# Patient Record
Sex: Female | Born: 1960 | Race: Black or African American | Hispanic: No | Marital: Single | Smoking: Never smoker
Health system: Southern US, Community
[De-identification: ages and names within clinical notes are randomized; demographics above are authoritative.]

## PROBLEM LIST (undated history)

## (undated) DIAGNOSIS — L309 Dermatitis, unspecified: Secondary | ICD-10-CM

## (undated) DIAGNOSIS — D509 Iron deficiency anemia, unspecified: Secondary | ICD-10-CM

## (undated) DIAGNOSIS — D219 Benign neoplasm of connective and other soft tissue, unspecified: Secondary | ICD-10-CM

## (undated) DIAGNOSIS — D259 Leiomyoma of uterus, unspecified: Secondary | ICD-10-CM

## (undated) DIAGNOSIS — F32A Depression, unspecified: Secondary | ICD-10-CM

## (undated) DIAGNOSIS — N950 Postmenopausal bleeding: Secondary | ICD-10-CM

## (undated) DIAGNOSIS — E611 Iron deficiency: Secondary | ICD-10-CM

## (undated) DIAGNOSIS — E785 Hyperlipidemia, unspecified: Secondary | ICD-10-CM

## (undated) DIAGNOSIS — I10 Essential (primary) hypertension: Secondary | ICD-10-CM

## (undated) DIAGNOSIS — K649 Unspecified hemorrhoids: Secondary | ICD-10-CM

## (undated) DIAGNOSIS — F329 Major depressive disorder, single episode, unspecified: Secondary | ICD-10-CM

## (undated) DIAGNOSIS — K219 Gastro-esophageal reflux disease without esophagitis: Secondary | ICD-10-CM

## (undated) DIAGNOSIS — Z8742 Personal history of other diseases of the female genital tract: Secondary | ICD-10-CM

## (undated) DIAGNOSIS — Z973 Presence of spectacles and contact lenses: Secondary | ICD-10-CM

## (undated) HISTORY — PX: CERVIX SURGERY: SHX593

## (undated) HISTORY — DX: Hyperlipidemia, unspecified: E78.5

## (undated) HISTORY — DX: Major depressive disorder, single episode, unspecified: F32.9

## (undated) HISTORY — DX: Leiomyoma of uterus, unspecified: D25.9

## (undated) HISTORY — DX: Iron deficiency anemia, unspecified: D50.9

## (undated) HISTORY — DX: Dermatitis, unspecified: L30.9

## (undated) HISTORY — DX: Iron deficiency: E61.1

## (undated) HISTORY — DX: Benign neoplasm of connective and other soft tissue, unspecified: D21.9

## (undated) HISTORY — DX: Essential (primary) hypertension: I10

## (undated) HISTORY — PX: BREAST SURGERY: SHX581

## (undated) HISTORY — DX: Depression, unspecified: F32.A

---

## 1986-01-13 HISTORY — PX: CERVICAL CONE BIOPSY: SUR198

## 1990-01-13 HISTORY — PX: BIOPSY BREAST: PRO8

## 1990-01-13 HISTORY — PX: EXCISION OF BREAST BIOPSY: SHX5822

## 1998-09-05 ENCOUNTER — Other Ambulatory Visit: Admission: RE | Admit: 1998-09-05 | Discharge: 1998-09-05 | Payer: Self-pay | Admitting: Obstetrics and Gynecology

## 1999-02-26 ENCOUNTER — Other Ambulatory Visit: Admission: RE | Admit: 1999-02-26 | Discharge: 1999-02-26 | Payer: Self-pay | Admitting: Obstetrics and Gynecology

## 1999-09-13 ENCOUNTER — Other Ambulatory Visit: Admission: RE | Admit: 1999-09-13 | Discharge: 1999-09-13 | Payer: Self-pay | Admitting: Obstetrics and Gynecology

## 2000-09-24 ENCOUNTER — Other Ambulatory Visit: Admission: RE | Admit: 2000-09-24 | Discharge: 2000-09-24 | Payer: Self-pay | Admitting: Obstetrics and Gynecology

## 2004-01-14 HISTORY — PX: CERVICAL BIOPSY  W/ LOOP ELECTRODE EXCISION: SUR135

## 2004-01-14 HISTORY — PX: LEEP: SHX91

## 2004-08-27 ENCOUNTER — Ambulatory Visit (HOSPITAL_COMMUNITY): Admission: RE | Admit: 2004-08-27 | Discharge: 2004-08-27 | Payer: Self-pay | Admitting: Obstetrics and Gynecology

## 2010-08-15 ENCOUNTER — Ambulatory Visit (INDEPENDENT_AMBULATORY_CARE_PROVIDER_SITE_OTHER): Payer: BC Managed Care – PPO

## 2010-08-15 ENCOUNTER — Inpatient Hospital Stay (INDEPENDENT_AMBULATORY_CARE_PROVIDER_SITE_OTHER)
Admission: RE | Admit: 2010-08-15 | Discharge: 2010-08-15 | Disposition: A | Discharge status: Home / Self Care | Payer: BC Managed Care – PPO | Source: Ambulatory Visit | Attending: Family Medicine | Admitting: Family Medicine

## 2010-08-15 DIAGNOSIS — M79609 Pain in unspecified limb: Secondary | ICD-10-CM

## 2011-01-14 HISTORY — PX: COLONOSCOPY: SHX174

## 2011-04-22 LAB — HM PAP SMEAR: HM Pap smear: NORMAL

## 2011-07-24 LAB — HM COLONOSCOPY: HM Colonoscopy: NORMAL

## 2012-10-04 ENCOUNTER — Ambulatory Visit: Payer: Self-pay | Admitting: Nurse Practitioner

## 2012-10-28 ENCOUNTER — Ambulatory Visit: Payer: Self-pay | Admitting: Nurse Practitioner

## 2012-12-21 ENCOUNTER — Encounter: Payer: Self-pay | Admitting: Nurse Practitioner

## 2012-12-22 ENCOUNTER — Encounter: Payer: Self-pay | Admitting: Nurse Practitioner

## 2012-12-22 ENCOUNTER — Ambulatory Visit (INDEPENDENT_AMBULATORY_CARE_PROVIDER_SITE_OTHER): Payer: BC Managed Care – PPO | Admitting: Nurse Practitioner

## 2012-12-22 VITALS — BP 108/64 | HR 75 | Temp 98.0°F | Resp 16 | Ht 62.0 in | Wt 107.6 lb

## 2012-12-22 DIAGNOSIS — E785 Hyperlipidemia, unspecified: Secondary | ICD-10-CM | POA: Insufficient documentation

## 2012-12-22 DIAGNOSIS — R5381 Other malaise: Secondary | ICD-10-CM

## 2012-12-22 DIAGNOSIS — Z23 Encounter for immunization: Secondary | ICD-10-CM

## 2012-12-22 DIAGNOSIS — R030 Elevated blood-pressure reading, without diagnosis of hypertension: Secondary | ICD-10-CM

## 2012-12-22 DIAGNOSIS — L259 Unspecified contact dermatitis, unspecified cause: Secondary | ICD-10-CM

## 2012-12-22 DIAGNOSIS — I10 Essential (primary) hypertension: Secondary | ICD-10-CM | POA: Insufficient documentation

## 2012-12-22 DIAGNOSIS — L309 Dermatitis, unspecified: Secondary | ICD-10-CM | POA: Insufficient documentation

## 2012-12-22 DIAGNOSIS — D509 Iron deficiency anemia, unspecified: Secondary | ICD-10-CM

## 2012-12-22 DIAGNOSIS — E611 Iron deficiency: Secondary | ICD-10-CM | POA: Insufficient documentation

## 2012-12-22 NOTE — Patient Instructions (Signed)
-  Have GI send [place] your lab work -make follow up appt for next year

## 2012-12-22 NOTE — Progress Notes (Signed)
Patient ID: Anna Mclean, female   DOB: [birth year], 52 y.o.   MRN: [id number]    No Known Allergies  Chief Complaint  Patient presents with  . Establish Care    establish care (NP)  . Immunizations    declines flu vaccine, and Td > 10 years  . other    very fatigue (drives 1 hour one way)    HPI: Patient is a 52 y.o. female seen in the office today to establish care; sees a gyn and GI doctor but no PCP.  Has history of hyperlipidemia and boarderline hypertension but this was modify with diet and exercise Last cholesterol screening was 3 years ago  Dr Henderson Cloud- GYN; mammogram 2014 normal- yearly;last PAP 2014,  - PAP have been normal, STD screening normal Dr Loreta AveSandria Manly; colonoscopy 2013- every 10 years  Gluten free diet due to iron deficient anemia; iron increased but she was loosing weight following up with Loreta Ave next week to see if she can reintroduce iron back into her diet  Occasional chest pain in different places, notices these pains randomly without associations and does not happen frequently; reports this pain is a 3/10; could be related to acid reflux (reports gurgling) last 3-5 secs every 2 weeks also reports upper chest pain 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 Negative for fever, chills and weight loss.  HENT: Negative for ear pain.   Eyes:       Dry eyes; corrective lens, follows with eye MD  Respiratory: Negative for cough and shortness of breath.   Cardiovascular: Negative for chest pain, palpitations, claudication and leg swelling.  Gastrointestinal: Positive for heartburn (rarely). Negative for abdominal pain, diarrhea and constipation.  Genitourinary: Negative for dysuria, urgency and frequency.  Musculoskeletal:  Positive for joint pain (occasionally thumbs ). Negative for back pain and myalgias.  Skin: Negative for itching and rash.  Neurological: Positive for headaches (occasional headaches that she has had her whole life). Negative for dizziness and tingling.  Psychiatric/Behavioral: Negative for depression. The patient is not nervous/anxious and does not have insomnia.      Past Medical History  Diagnosis Date  . Fibroids   . Low iron   . Eczema of hand   . Depression   . Hypertension   . Hyperlipidemia   . Anemia    Past Surgical History  Procedure Laterality Date  . Cervix surgery  1980's  . Breast surgery      calicum deposit removed   Social History:   reports that she has never smoked. She has never used smokeless tobacco. She reports that she drinks alcohol. She reports that she does not use illicit drugs.  Family History  Problem Relation Age of Onset  . Diabetes Father   . Arthritis Mother   . Lupus Mother     Medications: Patient's Medications  New Prescriptions   No medications on file  Previous Medications   DESOGESTREL-ETHINYL ESTRADIOL (VIORELE) 0.15-0.02/0.01 MG (21/5) TABLET    Take 1 tablet by mouth daily.   POLYVINYL ALCOHOL-POVIDONE (REFRESH OP)    Apply to eye as needed.  Modified Medications   No medications on file  Discontinued Medications   No medications on file     Physical Exam:  Ceasar Mons  Vitals:   12/22/12 0906  BP: 108/64  Pulse: 75  Temp: 98 F (36.7 C)  TempSrc: Oral  Resp: 16  Height: 5\' 2"  (1.575 m)  Weight: 107 lb 9.6 oz (48.807 kg)  SpO2: 98%    Physical Exam  Constitutional: She is oriented to person, place, and time and well-developed, well-nourished, and in no distress.  HENT:  Head: Normocephalic and atraumatic.  Right Ear: External ear normal.  Left Ear: External ear normal.  Nose: Nose normal.  Mouth/Throat: Oropharynx is clear and moist. No oropharyngeal exudate.  Eyes: Conjunctivae and EOM are normal. Pupils are  equal, round, and reactive to light.  Neck: Normal range of motion. Neck supple. No thyromegaly present.  Cardiovascular: Normal rate and regular rhythm.   Pulmonary/Chest: Effort normal and breath sounds normal. No respiratory distress.  Abdominal: Soft. Bowel sounds are normal. She exhibits no distension. There is no tenderness.  Musculoskeletal: Normal range of motion. She exhibits no edema and no tenderness.  Lymphadenopathy:    She has no cervical adenopathy.  Neurological: She is alert and oriented to person, place, and time.  Skin: Skin is warm and dry. She is not diaphoretic.  Psychiatric: Affect normal.     Assessment/Plan 1. Hyperlipidemia -will follow up lipid screening at next visit; pt wishes to wait until 2015 before getting labs  2. Iron deficiency anemia -following with GI; pt will get these labs sent to [place] for our records  3. Eczema of hand -well controlled with lotion  4. Borderline hypertension -diet and exercise controlled -will cont to follow  5. Need for Tdap vaccination - Tdap vaccine greater than or equal to 7yo IM  6. Fatigue Was doing regular exercise now is not Sleeping 7 hours a night Encourage to get back into exercise routine and to eat more fruits and vegetable; diet and exercise plays a roll in the feeling of fatigue -encouraged to get rolling bag and not to carry large back pack on shoulders  If this does not improve will get TSH at next visit   Will call for follow up physical

## 2013-06-02 LAB — HM PAP SMEAR

## 2013-06-07 ENCOUNTER — Other Ambulatory Visit: Payer: Self-pay | Admitting: Obstetrics and Gynecology

## 2013-06-07 DIAGNOSIS — N644 Mastodynia: Secondary | ICD-10-CM

## 2013-06-16 ENCOUNTER — Ambulatory Visit
Admission: RE | Admit: 2013-06-16 | Discharge: 2013-06-16 | Disposition: A | Discharge status: Home / Self Care | Payer: Managed Care, Other (non HMO) | Source: Ambulatory Visit | Attending: Obstetrics and Gynecology | Admitting: Obstetrics and Gynecology

## 2013-06-16 ENCOUNTER — Other Ambulatory Visit: Payer: Self-pay | Admitting: Obstetrics and Gynecology

## 2013-06-16 DIAGNOSIS — N644 Mastodynia: Secondary | ICD-10-CM

## 2014-05-09 ENCOUNTER — Encounter: Payer: Self-pay | Admitting: Nurse Practitioner

## 2014-05-09 ENCOUNTER — Ambulatory Visit (INDEPENDENT_AMBULATORY_CARE_PROVIDER_SITE_OTHER): Payer: Managed Care, Other (non HMO) | Admitting: Nurse Practitioner

## 2014-05-09 VITALS — BP 122/70 | HR 74 | Temp 98.0°F | Resp 18 | Ht 62.0 in | Wt 116.6 lb

## 2014-05-09 DIAGNOSIS — D509 Iron deficiency anemia, unspecified: Secondary | ICD-10-CM

## 2014-05-09 DIAGNOSIS — G44209 Tension-type headache, unspecified, not intractable: Secondary | ICD-10-CM

## 2014-05-09 DIAGNOSIS — E785 Hyperlipidemia, unspecified: Secondary | ICD-10-CM

## 2014-05-09 NOTE — Patient Instructions (Signed)
Will get blood work today  Use aleve 220 mg by mouth twice daily with food for 1 week then as needed Relaxation exercise, massage and heat  Follow up in 1 month

## 2014-05-09 NOTE — Progress Notes (Signed)
Patient ID: Anna Mclean, female   DOB: 12-Nov-1960, 54 y.o.   MRN: [id number]    PCP: Lauree Chandler, NP  Allergies  Allergen Reactions  . Shellfish Allergy Rash    Seen on face and around mouth    Chief Complaint  Patient presents with  . Medical Management of Chronic Issues  . Acute Visit    bad headaches since early March, pressure in ears and nasal area     HPI: Patient is a 54 y.o. female seen in the office today due to headaches. Pt having shooting pains in her head. Increased family and personal stress.  Question wax build up in her ears Pain in her neck and the back of her hand, no injury. Sometimes she sleeps certain ways and this increases the pain but does not always correlate to the pain.  Headaches on and off for years-- stress related, not ongoing. Would take an ASA which would help. Current headache is different which started end of February. Has tired ASA which has been effective. Happening about once a week.  Will happen in the morning or in the evening. Worse with stress. Worse on the right side of her head but sometimes it will be both sides, parietal and occipital areas-- not frontal Shooting pain. Not worse with light or sound. No nausea. Able to work through the pain  If she does not take ASA may last 15 mins-45 mins.      Sees GYN yearly for annual exam, gets breast exam, pelvic,mammogram yearly, due in June Fasting today  Review of Systems:  Review of Systems  Constitutional: Negative for fever and chills.  HENT: Negative for congestion, ear pain and sinus pressure.   Eyes:       Dry eyes; corrective lens, follows with eye MD  Respiratory: Negative for cough and shortness of breath.   Cardiovascular: Negative for chest pain and palpitations.  Gastrointestinal: Negative for diarrhea and constipation.  Musculoskeletal: Negative for myalgias, back pain, neck pain and neck stiffness.  Skin: Negative for color change and rash.  Neurological:  Positive for headaches (see HPI). Negative for dizziness, tremors, seizures, facial asymmetry, speech difficulty, weakness, light-headedness and numbness.  Psychiatric/Behavioral: The patient is nervous/anxious.     Past Medical History  Diagnosis Date  . Fibroids   . Low iron   . Eczema of hand   . Depression   . Hypertension   . Hyperlipidemia   . Iron deficiency anemia    Past Surgical History  Procedure Laterality Date  . Cervix surgery  1980's  . Breast surgery      calicum deposit removed   Social History:   reports that she has never smoked. She has never used smokeless tobacco. She reports that she drinks alcohol. She reports that she does not use illicit drugs.  Family History  Problem Relation Age of Onset  . Diabetes Father   . Arthritis Mother   . Lupus Mother     Medications: Patient's Medications  New Prescriptions   No medications on file  Previous Medications   DESOGESTREL-ETHINYL ESTRADIOL (VIORELE) 0.15-0.02/0.01 MG (21/5) TABLET    Take 1 tablet by mouth daily.   FERROUS SULFATE 325 (65 FE) MG TABLET    Take 325 mg by mouth 2 (two) times daily with a meal.   FLUOXETINE (PROZAC) 20 MG CAPSULE    Take 20 mg by mouth at bedtime.   POLYVINYL ALCOHOL-POVIDONE (REFRESH OP)    Apply to eye as needed.  Modified Medications   No medications on file  Discontinued Medications   No medications on file     Physical Exam:  Filed Vitals:   05/09/14 0837  BP: 122/70  Pulse: 74  Temp: 98 F (36.7 C)  TempSrc: Oral  Resp: 18  Height: 5\' 2"  (1.575 m)  Weight: 116 lb 9.6 oz (52.889 kg)  SpO2: 99%    Physical Exam  Constitutional: She is oriented to person, place, and time. She appears well-developed and well-nourished. No distress.  HENT:  Head: Normocephalic and atraumatic.  Right Ear: External ear normal.  Left Ear: External ear normal.  Nose: Nose normal.  Mouth/Throat: Oropharynx is clear and moist. No oropharyngeal exudate.  Eyes: Conjunctivae  and EOM are normal. Pupils are equal, round, and reactive to light.  Neck: Normal range of motion. Neck supple.  Cardiovascular: Normal rate, regular rhythm and normal heart sounds.   Pulmonary/Chest: Effort normal and breath sounds normal.  Abdominal: Soft. Bowel sounds are normal.  Musculoskeletal: She exhibits no edema or tenderness.  Lymphadenopathy:    She has no cervical adenopathy.  Neurological: She is alert and oriented to person, place, and time. She has normal reflexes. She displays normal reflexes. No cranial nerve deficit or sensory deficit. She exhibits normal muscle tone. Coordination normal.  Skin: Skin is warm and dry. She is not diaphoretic.  Psychiatric: She has a normal mood and affect.    Labs reviewed: Basic Metabolic Panel: No results for input(s): NA, K, CL, CO2, GLUCOSE, BUN, CREATININE, CALCIUM, MG, PHOS, TSH in the last 8760 hours. Liver Function Tests: No results for input(s): AST, ALT, ALKPHOS, BILITOT, PROT, ALBUMIN in the last 8760 hours. No results for input(s): LIPASE, AMYLASE in the last 8760 hours. No results for input(s): AMMONIA in the last 8760 hours. CBC: No results for input(s): WBC, NEUTROABS, HGB, HCT, MCV, PLT in the last 8760 hours. Lipid Panel: No results for input(s): CHOL, HDL, LDLCALC, TRIG, CHOLHDL, LDLDIRECT in the last 8760 hours. TSH: No results for input(s): TSH in the last 8760 hours. A1C: No results found for: HGBA1C   Assessment/Plan 1. Tension-type headache, not intractable, unspecified chronicity pattern -no warning signs noted, no neurological deficits associated with headaches or on exam. BP in appropriate range, no hx of HTN - due to the increase stress with job and person life most likely tension-type headache. -Use aleve 220 mg by mouth twice daily with food for 1 week then as needed Relaxation exercise, massage and heat as needed encouraged -will get blood work today - CBC with Differential/Platelet - Sedimentation  Rate -CMP  2. Borderline hyperlipidemia -will obtain labs at this time - Comprehensive metabolic panel - Lipid panel  3. Iron deficiency anemia -conts on iron -will follow up CBC   Will follow up in 1 month on headaches

## 2014-05-10 LAB — COMPREHENSIVE METABOLIC PANEL
ALK PHOS: 73 IU/L (ref 39–117)
ALT: 9 IU/L (ref 0–32)
AST: 14 IU/L (ref 0–40)
Albumin/Globulin Ratio: 1.4 (ref 1.1–2.5)
Albumin: 4.3 g/dL (ref 3.5–5.5)
BILIRUBIN TOTAL: 0.3 mg/dL (ref 0.0–1.2)
BUN/Creatinine Ratio: 17 (ref 9–23)
BUN: 12 mg/dL (ref 6–24)
CALCIUM: 9.3 mg/dL (ref 8.7–10.2)
CHLORIDE: 103 mmol/L (ref 97–108)
CO2: 19 mmol/L (ref 18–29)
Creatinine, Ser: 0.72 mg/dL (ref 0.57–1.00)
GFR calc non Af Amer: 96 mL/min/{1.73_m2} (ref >59)
GFR, EST AFRICAN AMERICAN: 111 mL/min/{1.73_m2} (ref >59)
GLOBULIN, TOTAL: 3 g/dL (ref 1.5–4.5)
Glucose: 86 mg/dL (ref 65–99)
Potassium: 4 mmol/L (ref 3.5–5.2)
SODIUM: 143 mmol/L (ref 134–144)
Total Protein: 7.3 g/dL (ref 6.0–8.5)

## 2014-05-10 LAB — LIPID PANEL
CHOLESTEROL TOTAL: 255 mg/dL — AB (ref 100–199)
Chol/HDL Ratio: 3.1 ratio units (ref 0.0–4.4)
HDL: 83 mg/dL (ref >39)
LDL Calculated: 154 mg/dL — ABNORMAL HIGH (ref 0–99)
Triglycerides: 90 mg/dL (ref 0–149)
VLDL CHOLESTEROL CAL: 18 mg/dL (ref 5–40)

## 2014-05-10 LAB — CBC WITH DIFFERENTIAL/PLATELET
BASOS ABS: 0.1 10*3/uL (ref 0.0–0.2)
Basos: 1 %
EOS (ABSOLUTE): 0.1 10*3/uL (ref 0.0–0.4)
Eos: 2 %
HEMATOCRIT: 36.6 % (ref 34.0–46.6)
Hemoglobin: 12 g/dL (ref 11.1–15.9)
Immature Grans (Abs): 0 10*3/uL (ref 0.0–0.1)
Immature Granulocytes: 0 %
LYMPHS ABS: 1.2 10*3/uL (ref 0.7–3.1)
LYMPHS: 19 %
MCH: 29.1 pg (ref 26.6–33.0)
MCHC: 32.8 g/dL (ref 31.5–35.7)
MCV: 89 fL (ref 79–97)
MONOS ABS: 0.4 10*3/uL (ref 0.1–0.9)
Monocytes: 6 %
NEUTROS ABS: 4.6 10*3/uL (ref 1.4–7.0)
Neutrophils: 72 %
Platelets: 292 10*3/uL (ref 150–379)
RBC: 4.13 x10E6/uL (ref 3.77–5.28)
RDW: 13.9 % (ref 12.3–15.4)
WBC: 6.4 10*3/uL (ref 3.4–10.8)

## 2014-05-10 LAB — SEDIMENTATION RATE: Sed Rate: 20 mm/hr (ref 0–40)

## 2014-06-08 ENCOUNTER — Encounter: Payer: Self-pay | Admitting: Nurse Practitioner

## 2014-06-08 ENCOUNTER — Ambulatory Visit (INDEPENDENT_AMBULATORY_CARE_PROVIDER_SITE_OTHER): Payer: Managed Care, Other (non HMO) | Admitting: Nurse Practitioner

## 2014-06-08 VITALS — BP 128/82 | HR 71 | Temp 98.2°F | Resp 18 | Ht 62.0 in | Wt 117.4 lb

## 2014-06-08 DIAGNOSIS — G44209 Tension-type headache, unspecified, not intractable: Secondary | ICD-10-CM

## 2014-06-08 DIAGNOSIS — E785 Hyperlipidemia, unspecified: Secondary | ICD-10-CM

## 2014-06-08 DIAGNOSIS — D509 Iron deficiency anemia, unspecified: Secondary | ICD-10-CM | POA: Diagnosis not present

## 2014-06-08 DIAGNOSIS — F419 Anxiety disorder, unspecified: Secondary | ICD-10-CM | POA: Diagnosis not present

## 2014-06-08 NOTE — Progress Notes (Signed)
Patient ID: Anna Mclean, female   DOB: 1960-05-28, 54 y.o.   MRN: [id number]    PCP: Lauree Chandler, NP  Allergies  Allergen Reactions  . Shellfish Allergy Rash    Seen on face and around mouth    Chief Complaint  Patient presents with  . Medical Management of Chronic Issues     HPI: Patient is a 54 y.o. female seen in the office today to follow up headaches, headaches went away after last visit. Then got another headache yesterday and took aleve today and it has improved. Doing relaxation techniques, feels like it was the way she was sleeping and stress related. Stress at work and at home. Used heat and exercise after last visit. Exercise helped the most.   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  HENT: Negative for congestion, ear pain and sinus pressure.   Eyes:       Dry eyes; corrective lens, follows with eye MD  Respiratory: Negative for cough and shortness of breath.   Cardiovascular: Negative for chest pain and palpitations.  Gastrointestinal: Negative for diarrhea and constipation.  Musculoskeletal: Negative for myalgias, back pain, neck pain and neck stiffness.  Skin: Negative for color change and rash.  Neurological: Positive for headaches (has improved). Negative for dizziness, tremors, seizures, facial asymmetry, speech difficulty, weakness, light-headedness and numbness.  Psychiatric/Behavioral: The patient is nervous/anxious.     Past Medical History  Diagnosis Date  . Fibroids   . Low iron   . Eczema of hand   . Depression   . Hypertension   . Hyperlipidemia   . Iron deficiency anemia    Past Surgical History  Procedure Laterality Date    . Cervix surgery  1980's  . Breast surgery      calicum deposit removed   Social History:   reports that she has never smoked. She has never used smokeless tobacco. She reports that she drinks alcohol. She reports that she does not use illicit drugs.  Family History  Problem Relation Age of Onset  . Diabetes Father   . Arthritis Mother   . Lupus Mother     Medications: Patient's Medications  New Prescriptions   No medications on file  Previous Medications   DESOGESTREL-ETHINYL ESTRADIOL (VIORELE) 0.15-0.02/0.01 MG (21/5) TABLET    Take 1 tablet by mouth daily.   FERROUS SULFATE 325 (65 FE) MG TABLET    Take 325 mg by mouth 2 (two) times daily with a meal.   FLUOXETINE (PROZAC) 20 MG CAPSULE    Take 20 mg by mouth at bedtime.   POLYVINYL ALCOHOL-POVIDONE (REFRESH OP)    Apply to eye as needed.  Modified Medications   No medications on file  Discontinued Medications   No medications on file     Physical Exam:  Filed Vitals:   06/08/14 1054  BP: 128/82  Pulse: 71  Temp: 98.2 F (36.8 C)  TempSrc: Oral  Resp: 18  Height: 5\' 2"  (1.575 m)  Weight: 117 lb 6.4 oz (53.252 kg)  SpO2: 99%    Physical Exam  Constitutional: She is oriented to person, place, and time. She appears well-developed and well-nourished. No distress.  HENT:  Head: Normocephalic and atraumatic.  Right Ear: External ear normal.  Left Ear: External ear normal.  Nose: Nose normal.  Mouth/Throat: Oropharynx is clear and moist. No oropharyngeal exudate.  Eyes: Conjunctivae and EOM are normal. Pupils are equal, round, and reactive to light.  Neck: Normal range of motion. Neck supple.  Cardiovascular: Normal rate, regular rhythm and normal heart sounds.   Pulmonary/Chest: Effort normal and breath sounds normal.  Abdominal: Soft. Bowel sounds are normal.  Musculoskeletal: She exhibits no edema or tenderness.  Lymphadenopathy:    She has no cervical adenopathy.  Neurological: She is alert and oriented  to person, place, and time. She has normal reflexes. No cranial nerve deficit or sensory deficit. She exhibits normal muscle tone. Coordination normal.  Skin: Skin is warm and dry. She is not diaphoretic.  Psychiatric: She has a normal mood and affect.    Labs reviewed: Basic Metabolic Panel:  Recent Labs  05/09/14 0945  NA 143  K 4.0  CL 103  CO2 19  GLUCOSE 86  BUN 12  CREATININE 0.72  CALCIUM 9.3   Liver Function Tests:  Recent Labs  05/09/14 0945  AST 14  ALT 9  ALKPHOS 73  BILITOT 0.3  PROT 7.3   No results for input(s): LIPASE, AMYLASE in the last 8760 hours. No results for input(s): AMMONIA in the last 8760 hours. CBC:  Recent Labs  05/09/14 0945  WBC 6.4  NEUTROABS 4.6  HCT 36.6   Lipid Panel:  Recent Labs  05/09/14 0945  CHOL 255*  HDL 83  LDLCALC 154*  TRIG 90  CHOLHDL 3.1   TSH: No results for input(s): TSH in the last 8760 hours. A1C: No results found for: HGBA1C   Assessment/Plan 1. Tension-type headache, not intractable, unspecified chronicity pattern -improved, worse with increase stress, to cont lifestyle modification when she can -avoid excessive stressors.   2. Borderline hyperlipidemia -discussed lipid, elevated LDL but elevated HDL as well.  -she reports she will make some changes to lower LDL -dietary modifications for now  3. Iron deficiency anemia Cbc reviewed with pt, hgb stable at this time -conts on iron twice daily   4. Anxiety - conts on fluoxetine 20 mg daily   Follow up in 6 months, sooner if needed

## 2014-06-22 ENCOUNTER — Other Ambulatory Visit: Payer: Self-pay | Admitting: Obstetrics and Gynecology

## 2014-06-22 DIAGNOSIS — R928 Other abnormal and inconclusive findings on diagnostic imaging of breast: Secondary | ICD-10-CM

## 2014-06-29 ENCOUNTER — Ambulatory Visit
Admission: RE | Admit: 2014-06-29 | Discharge: 2014-06-29 | Disposition: A | Discharge status: Home / Self Care | Payer: Managed Care, Other (non HMO) | Source: Ambulatory Visit | Attending: Obstetrics and Gynecology | Admitting: Obstetrics and Gynecology

## 2014-06-29 DIAGNOSIS — R928 Other abnormal and inconclusive findings on diagnostic imaging of breast: Secondary | ICD-10-CM

## 2014-12-12 ENCOUNTER — Ambulatory Visit: Payer: Managed Care, Other (non HMO) | Admitting: Nurse Practitioner

## 2015-08-14 LAB — HM MAMMOGRAPHY

## 2015-08-17 LAB — HM PAP SMEAR

## 2016-02-10 IMAGING — MG MM DIAG BREAST TOMO UNI LEFT
6 series · 6 of 18 positions shown · non-contrast
Comparison: 05/10/2010 through 06/02/2013

CLINICAL DATA: Patient is an asymptomatic 54-year-old female who
was recalled from screening mammography to further evaluate the left
breast.

EXAM:
DIGITAL DIAGNOSTIC LEFT MAMMOGRAM WITH 3D TOMOSYNTHESIS AND CAD

[L ML (1 of 2)]
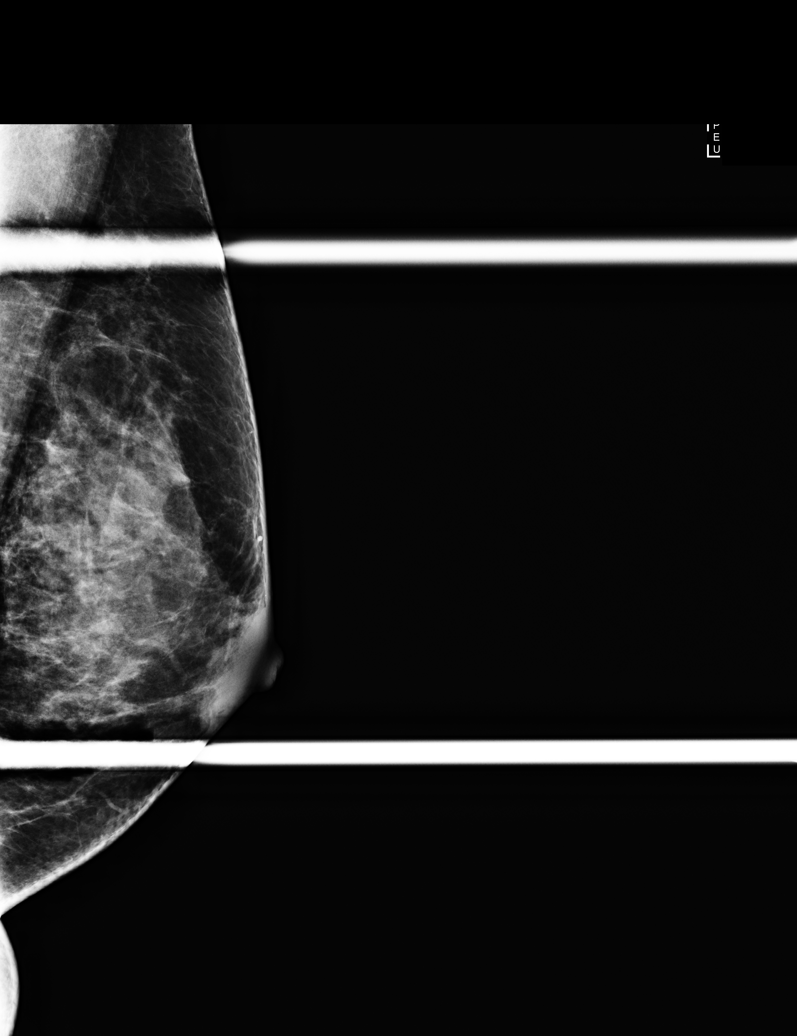

[L MLO]
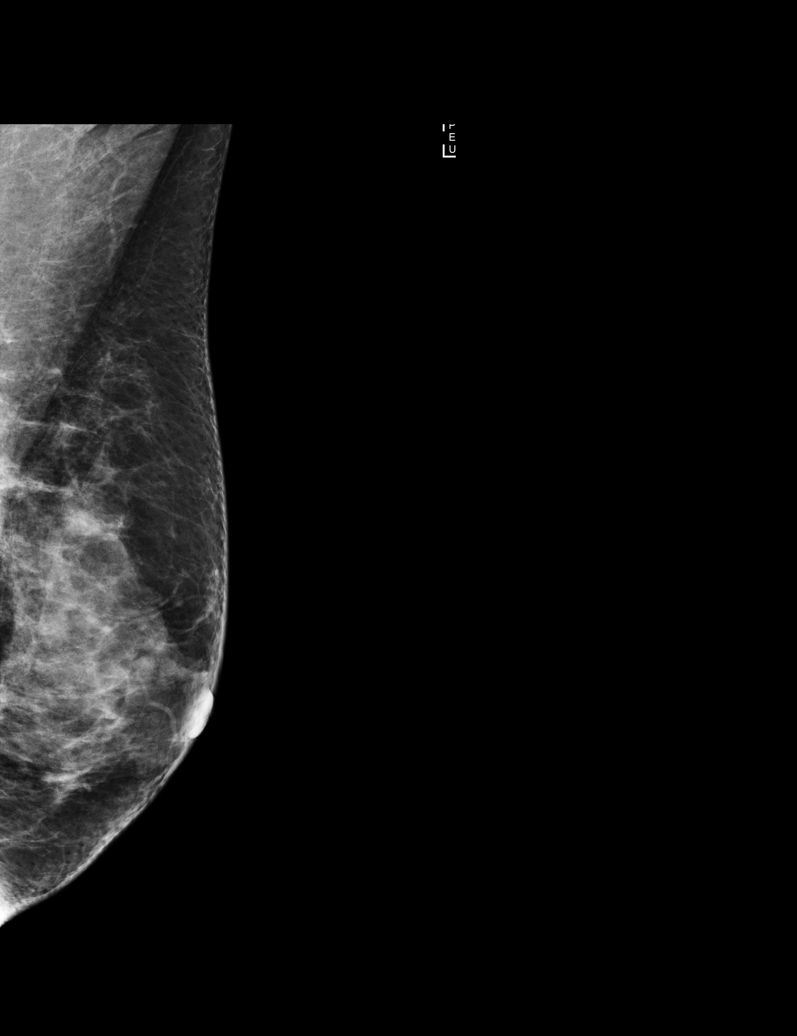

[L ML (2 of 2)]
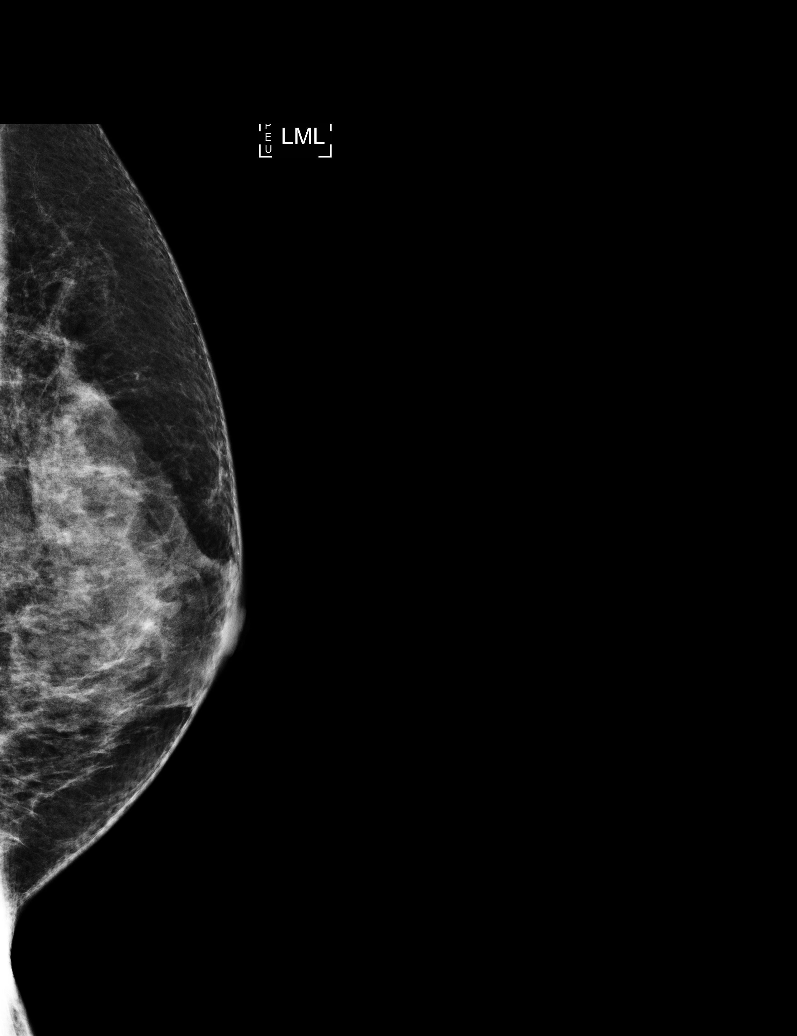

[L ML tomo (1 of 2) · tomo slice 31/62.0]
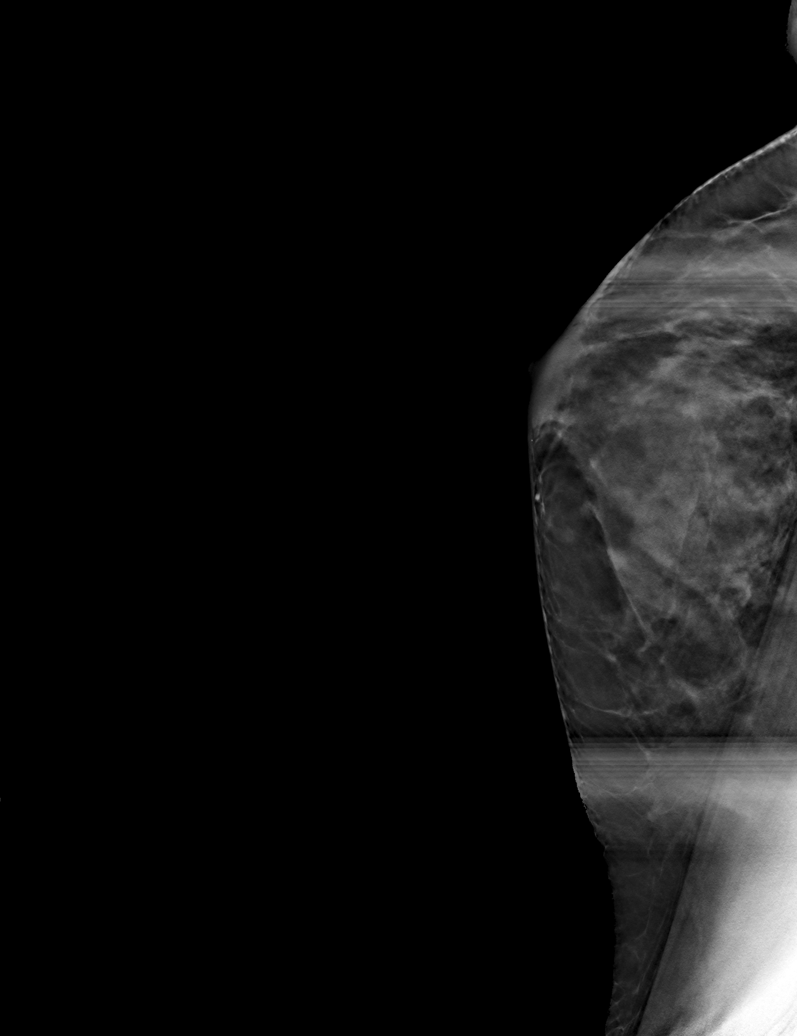

[L MLO tomo · tomo slice 33/65.0]
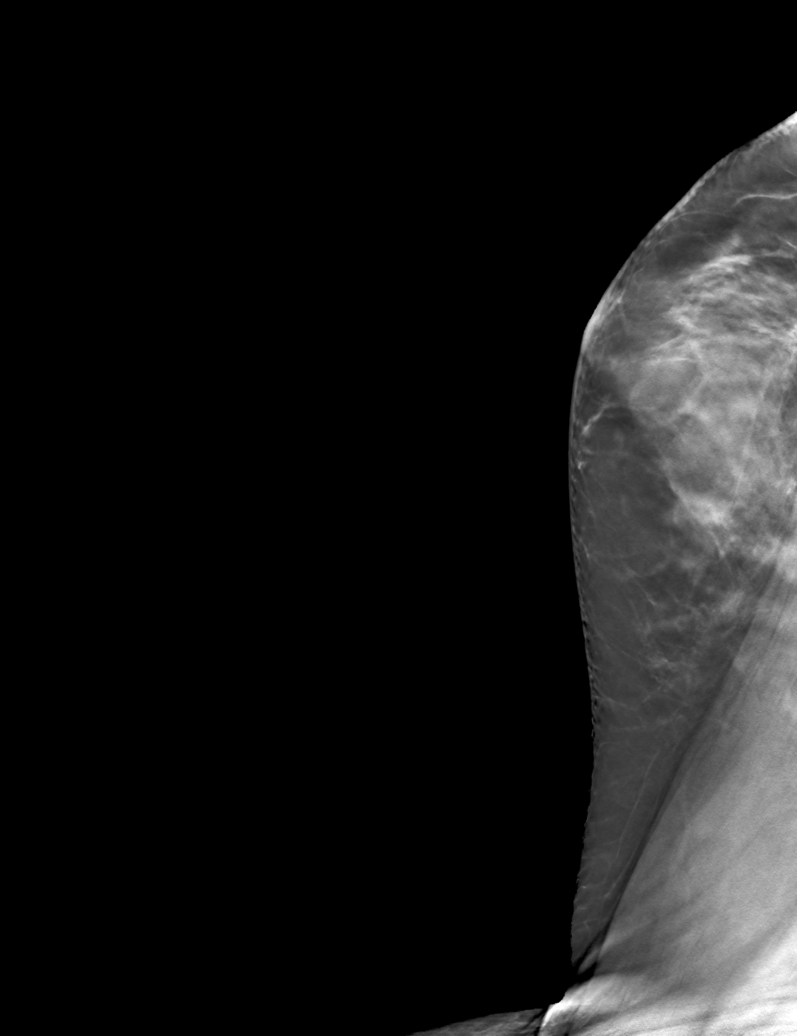

[L ML tomo (2 of 2) · tomo slice 29/56.0]
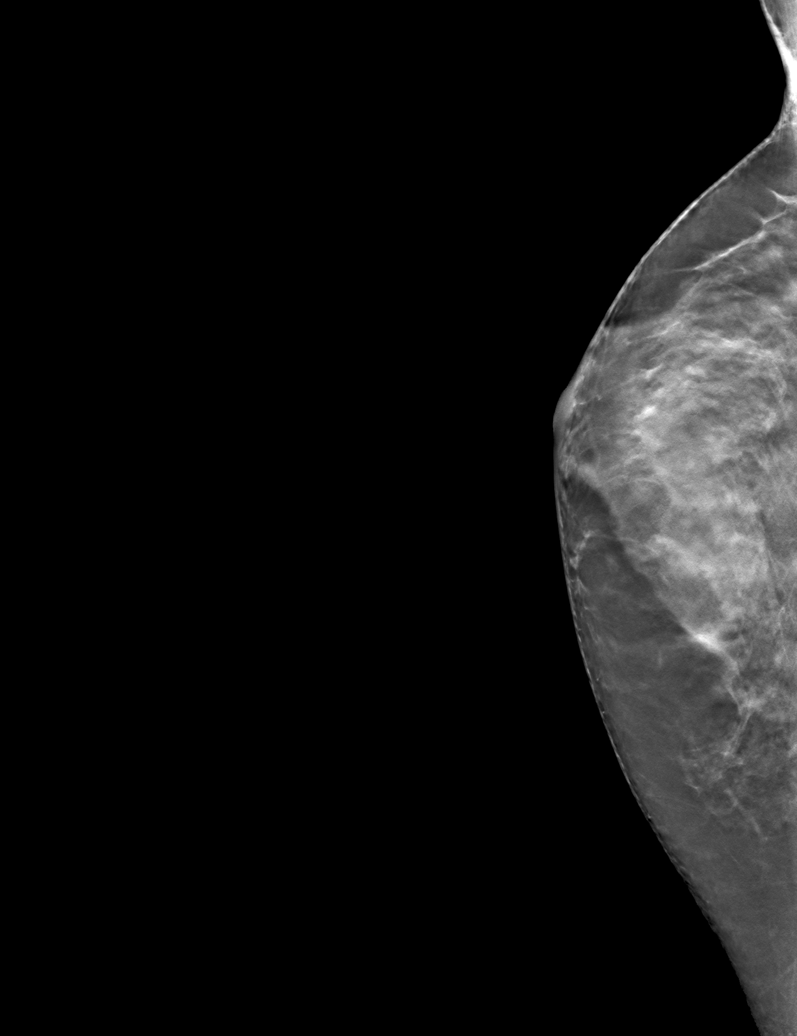

[6 of 18 positions shown; findings below may reference images not displayed]

ACR Breast Density Category c: The breast tissue is heterogeneously
dense, which may obscure small masses.
FINDINGS: Left unilateral digital diagnostic mammography demonstrates a
heterogeneously dense parenchymal pattern which may obscure
detection of small masses. The previously noted questionable
asymmetry is not reproducible on the mediolateral or mediolateral
oblique mammographic projections and has hair the breast completely
effaces with spot compression view consistent with summation of
normal fibroglandular tissue. No abnormal mass or suspicious
microcalcification.

Mammographic images were processed with CAD.
IMPRESSION: No mammographic findings of malignancy in the left breast.

RECOMMENDATION:
Screening mammogram in one year.(Code:SQ-Y-T0U)

I have discussed the findings and recommendations with the patient.
Results were also provided in writing at the conclusion of the
visit. If applicable, a reminder letter will be sent to the patient
regarding the next appointment.

BI-RADS CATEGORY  1: Negative.

## 2018-08-18 ENCOUNTER — Other Ambulatory Visit: Payer: Self-pay

## 2018-08-18 DIAGNOSIS — Z20822 Contact with and (suspected) exposure to covid-19: Secondary | ICD-10-CM

## 2018-08-19 LAB — NOVEL CORONAVIRUS, NAA: SARS-CoV-2, NAA: NOT DETECTED

## 2019-11-14 ENCOUNTER — Other Ambulatory Visit: Payer: Managed Care, Other (non HMO)

## 2019-11-14 DIAGNOSIS — Z20822 Contact with and (suspected) exposure to covid-19: Secondary | ICD-10-CM

## 2019-11-15 LAB — SARS-COV-2, NAA 2 DAY TAT

## 2019-11-15 LAB — NOVEL CORONAVIRUS, NAA: SARS-CoV-2, NAA: NOT DETECTED

## 2020-07-04 ENCOUNTER — Other Ambulatory Visit (HOSPITAL_BASED_OUTPATIENT_CLINIC_OR_DEPARTMENT_OTHER): Payer: Self-pay

## 2020-07-04 ENCOUNTER — Other Ambulatory Visit: Payer: Self-pay

## 2020-07-04 ENCOUNTER — Ambulatory Visit: Payer: Managed Care, Other (non HMO) | Attending: Internal Medicine

## 2020-07-04 DIAGNOSIS — Z23 Encounter for immunization: Secondary | ICD-10-CM

## 2020-07-04 MED ORDER — PFIZER-BIONT COVID-19 VAC-TRIS 30 MCG/0.3ML IM SUSP
INTRAMUSCULAR | 0 refills | Status: DC
  Filled 2020-07-04: qty 0.3, 1d supply, fill #0

## 2020-07-04 NOTE — Progress Notes (Signed)
   Covid-19 Vaccination Clinic  Name:  Anna Mclean    MRN: [id number] DOB: 12/02/1960  07/04/2020  Anna Mclean was observed post Covid-19 immunization for 15 minutes without incident. She was provided with Vaccine Information Sheet and instruction to access the V-Safe system.   Anna Mclean was instructed to call 911 with any severe reactions post vaccine: Difficulty breathing  Swelling of face and throat  A fast heartbeat  A bad rash all over body  Dizziness and weakness   Immunizations Administered     Name Date Dose VIS Date Route   PFIZER Comrnaty(Gray TOP) Covid-19 Vaccine 07/04/2020  2:14 PM 0.3 mL 12/22/2019 Intramuscular   Manufacturer: [place]   Lot: [id number]   [place]: [phone]

## 2020-07-05 ENCOUNTER — Other Ambulatory Visit (HOSPITAL_BASED_OUTPATIENT_CLINIC_OR_DEPARTMENT_OTHER): Payer: Self-pay

## 2020-12-07 ENCOUNTER — Encounter (HOSPITAL_COMMUNITY): Payer: Self-pay | Admitting: Emergency Medicine

## 2020-12-07 ENCOUNTER — Ambulatory Visit (HOSPITAL_COMMUNITY)
Admission: EM | Admit: 2020-12-07 | Discharge: 2020-12-07 | Disposition: A | Discharge status: Home / Self Care | Payer: Managed Care, Other (non HMO) | Attending: Internal Medicine | Admitting: Internal Medicine

## 2020-12-07 ENCOUNTER — Other Ambulatory Visit: Payer: Self-pay

## 2020-12-07 DIAGNOSIS — J020 Streptococcal pharyngitis: Secondary | ICD-10-CM

## 2020-12-07 LAB — POC INFLUENZA A AND B ANTIGEN (URGENT CARE ONLY)
INFLUENZA A ANTIGEN, POC: NEGATIVE
INFLUENZA B ANTIGEN, POC: NEGATIVE

## 2020-12-07 LAB — POCT RAPID STREP A, ED / UC: Streptococcus, Group A Screen (Direct): POSITIVE — AB

## 2020-12-07 MED ORDER — AMOXICILLIN 500 MG PO CAPS: 500.0000 mg | ORAL_CAPSULE | Freq: Two times a day (BID) | ORAL | 0 refills | Status: AC

## 2020-12-07 NOTE — ED Provider Notes (Signed)
[place]    CSN: [id number] Arrival date & time: 12/07/20  1238      History   Chief Complaint Chief Complaint  Patient presents with   Cough   Sore Throat    HPI Anna Mclean is a 60 y.o. female comes to the urgent care with a 1 day history of sore throat and painful swallowing.  Patient symptoms started insidiously and has been persistent.  She describes subjective fever without chills.  No nausea, vomiting or diarrhea.  Patient denies any runny nose or nasal congestion.Marland Kitchen   HPI  Past Medical History:  Diagnosis Date   Depression    Eczema of hand    Fibroids    Hyperlipidemia    Hypertension    Iron deficiency anemia    Low iron     Patient Active Problem List   Diagnosis Date Noted   Borderline hypertension 12/22/2012   Borderline hyperlipidemia    Iron deficiency anemia    Eczema of hand     Past Surgical History:  Procedure Laterality Date   BREAST SURGERY     calicum deposit removed   CERVICAL BIOPSY  W/ LOOP ELECTRODE EXCISION  01/14/2004   CERVIX SURGERY  1980's    OB History   No obstetric history on file.      Home Medications    Prior to Admission medications   Medication Sig Start Date End Date Taking? Authorizing Provider  amoxicillin (AMOXIL) 500 MG capsule Take 1 capsule (500 mg total) by mouth 2 (two) times daily for 10 days. 12/07/20 12/17/20 Yes Khamani Daniely, Myrene Galas, MD  COVID-19 mRNA Vac-TriS, Pfizer, (PFIZER-BIONT COVID-19 VAC-TRIS) SUSP injection Inject into the muscle. 07/04/20   Carlyle Basques, MD  desogestrel-ethinyl estradiol (VIORELE) 0.15-0.02/0.01 MG (21/5) tablet Take 1 tablet by mouth daily.    [provider]  ferrous sulfate 325 (65 FE) MG tablet Take 325 mg by mouth 2 (two) times daily with a meal.    [provider]  norethindrone-ethinyl estradiol (FYAVOLV) 1-5 MG-MCG TABS tablet Fyavolv 1 mg-5 mcg tablet  TAKE 1 TABLET BY MOUTH EVERY DAY    [provider]  Polyvinyl  Alcohol-Povidone ([place] [facility]) Apply to eye as needed.    [provider]    Family History Family History  Problem Relation Age of Onset   Diabetes Father    Arthritis Mother    Lupus Mother     Social History Social History   Tobacco Use   Smoking status: Never   Smokeless tobacco: Never  Substance Use Topics   Alcohol use: Yes    Comment: Once every 3-4 months (very infrequently)    Drug use: No     Allergies   Shellfish allergy   Review of Systems Review of Systems  Constitutional: Negative.   HENT:  Positive for sore throat and trouble swallowing.   Respiratory: Negative.    Gastrointestinal: Negative.   Genitourinary: Negative.   Neurological: Negative.     Physical Exam Triage Vital Signs ED Triage Vitals  Enc Vitals Group     BP 12/07/20 1354 (!) 151/88     Pulse Rate 12/07/20 1354 98     Resp 12/07/20 1354 18     Temp 12/07/20 1354 99.5 F (37.5 C)     Temp src --      SpO2 12/07/20 1354 100 %     Weight --      Height --      Head Circumference --  Peak Flow --      Pain Score 12/07/20 1352 0     Pain Loc --      Pain Edu? --      Excl. in [place]? --    No data found.  Updated Vital Signs BP (!) 151/88   Pulse 98   Temp 99.5 F (37.5 C)   Resp 18   SpO2 100%   Visual Acuity Right Eye Distance:   Left Eye Distance:   Bilateral Distance:    Right Eye Near:   Left Eye Near:    Bilateral Near:     Physical Exam Vitals and nursing note reviewed.  Constitutional:      General: She is not in acute distress.    Appearance: She is not ill-appearing.  HENT:     Right Ear: Tympanic membrane normal.     Left Ear: Tympanic membrane normal.     Nose: No rhinorrhea.     Mouth/Throat:     Tonsils: Tonsillar exudate present. No tonsillar abscesses. 0 on the right. 0 on the left.  Cardiovascular:     Rate and Rhythm: Normal rate and regular rhythm.  Neurological:     Mental Status: She is alert.     UC Treatments / Results   Labs (all labs ordered are listed, but only abnormal results are displayed) Labs Reviewed  POCT RAPID STREP A, ED / UC - Abnormal; Notable for the following components:      Result Value   Streptococcus, Group A Screen (Direct) POSITIVE (*)    All other components within normal limits  POC INFLUENZA A AND B ANTIGEN (URGENT CARE ONLY)    EKG   Radiology No results found.  Procedures Procedures (including critical care time)  Medications Ordered in UC Medications - No data to display  Initial Impression / Assessment and Plan / UC Course  I have reviewed the triage vital signs and the nursing notes.  Pertinent labs & imaging results that were available during my care of the patient were reviewed by me and considered in my medical decision making (see chart for details).     1.  Streptococcal pharyngitis: Point-of-care strep is positive Point of care flu is negative for flu A and B Warm salt water gargle Ibuprofen or Tylenol as needed for pain Return to urgent care if symptoms worsen. Amoxicillin 500 mg twice daily for 10 days. Final Clinical Impressions(s) / UC Diagnoses   Final diagnoses:  Strep throat     Discharge Instructions      Warm salt water gargle Tylenol or Motrin as needed for pain Return to urgent care if symptoms worsen.   ED Prescriptions     Medication Sig Dispense Auth. Provider   amoxicillin (AMOXIL) 500 MG capsule Take 1 capsule (500 mg total) by mouth 2 (two) times daily for 10 days. 20 capsule Tryniti Laatsch, Myrene Galas, MD      PDMP not reviewed this encounter.   Chase Picket, MD 12/07/20 1444

## 2020-12-07 NOTE — ED Triage Notes (Signed)
Pt is present today with sore throat, cough, and chest congestion. Pt sx started last night

## 2020-12-07 NOTE — Discharge Instructions (Addendum)
Warm salt water gargle Tylenol or Motrin as needed for pain Return to urgent care if symptoms worsen.

## 2021-12-08 LAB — COLOGUARD: Cologuard: NEGATIVE

## 2021-12-13 ENCOUNTER — Other Ambulatory Visit: Payer: Self-pay

## 2021-12-19 ENCOUNTER — Encounter: Payer: Self-pay | Admitting: Nurse Practitioner

## 2022-03-04 LAB — HM PAP SMEAR

## 2023-03-12 DIAGNOSIS — Z9289 Personal history of other medical treatment: Secondary | ICD-10-CM

## 2023-03-12 HISTORY — DX: Personal history of other medical treatment: Z92.89

## 2023-03-12 LAB — HM MAMMOGRAPHY

## 2023-05-27 ENCOUNTER — Other Ambulatory Visit: Payer: Self-pay | Admitting: Obstetrics and Gynecology

## 2023-05-27 DIAGNOSIS — Z01818 Encounter for other preprocedural examination: Secondary | ICD-10-CM

## 2023-05-27 MED ORDER — METRONIDAZOLE IVPB CUSTOM
500.0000 mg | Freq: Two times a day (BID) | INTRAVENOUS | Status: AC
  Administered 2023-10-07: 500 mg via INTRAVENOUS

## 2023-09-14 NOTE — H&P (Signed)
 63 y.o. complains of enlarging fibroid uterus with postmenopausal bleeding.  Previously: Pre Op visit today. robotic assisted hysterectomy with salpingectomy scheduled for 09/24 d/t fibroids. -SH//  Previously: RTC 2 weeks for results and next steps. Uterus is bigger than 2024 with largest fibroid increased in size. uterine leiomyoma Pelvic T/V U/S Ordered US  pelvis complete and transvaginal Provided uterine fibroids: care instructions postmenopausal bleeding Checked tissue specimen-endometrium (formalin-filled specimen container only)- uwh [place] [facility] History of Present Illness GYN scan today d/t uterine fibroids. -SH//  EM is still obscured by fibroids and unable to be evaluated. For EMB for pmp bleeding today.  Physician interpretation: 14x11x8; EM NOT seen- obscured. Nine measured fibroids, largest 8 cm mid body. All calcified, ?one degenerating with fluid around it? Ov not seen./mahmd  Only LUS em gotten with specimen but suspect that fibroids are distorting the cavity. Fibroids have gotten substantially bigger- d/w pt that this is the time to a. make sure we can get the uterus out with minimally invasive and b. stop chasing PMP bleeding. D/w pt risks and benefits of leaving or taking ovaries- will decide closer to surgery. Will schedule in Sept. Still has had episodes of bleeding.  Past Medical History:  Diagnosis Date   Depression    Eczema of hand    Fibroids    H/O mammogram 03/12/2023   Per records from OB/GYN   Hyperlipidemia    Hypertension    Iron deficiency anemia    Low iron    Uterine fibroid    per records from GYN   Uterine leiomyoma    per records from GYN   Past Surgical History:  Procedure Laterality Date   BIOPSY BREAST  1992   per records from GYN   BREAST SURGERY     calicum deposit removed   CERVICAL BIOPSY  W/ LOOP ELECTRODE EXCISION  01/14/2004   CERVIX SURGERY  1980's   COLONOSCOPY  01/14/2011   cologuard negative 12/08/2021, Per records  from OB/GYN   LEEP  2006   Per records from GYN    Social History   Socioeconomic History   Marital status: Single    Spouse name: Not on file   Number of children: Not on file   Years of education: Not on file   Highest education level: Not on file  Occupational History   Not on file  Tobacco Use   Smoking status: Never   Smokeless tobacco: Never  Substance and Sexual Activity   Alcohol use: Yes    Comment: Once every 3-4 months (very infrequently)    Drug use: No   Sexual activity: Yes    Birth control/protection: Pill  Other Topics Concern   Not on file  Social History Narrative   LMP 10/19/2018, per records from GYN   Social Drivers of Health   Financial Resource Strain: Not on file  Food Insecurity: Not on file  Transportation Needs: Not on file  Physical Activity: Not on file  Stress: Not on file  Social Connections: Not on file  Intimate Partner Violence: Not on file    No current facility-administered medications on file prior to encounter.   Current Outpatient Medications on File Prior to Encounter  Medication Sig Dispense Refill   COVID-19 mRNA Vac-TriS, Pfizer, (PFIZER-BIONT COVID-19 VAC-TRIS) SUSP injection Inject into the muscle. 0.3 mL 0   desogestrel-ethinyl estradiol (VIORELE) 0.15-0.02/0.01 MG (21/5) tablet Take 1 tablet by mouth daily.     ferrous sulfate 325 (65 FE) MG tablet Take 325 mg  by mouth 2 (two) times daily with a meal.     norethindrone-ethinyl estradiol (FYAVOLV) 1-5 MG-MCG TABS tablet Fyavolv 1 mg-5 mcg tablet  TAKE 1 TABLET BY MOUTH EVERY DAY     Polyvinyl Alcohol-Povidone ([place] OP) Apply to eye as needed.      Allergies  Allergen Reactions   Shellfish Allergy Rash    Seen on face and around mouth    There were no vitals filed for this visit.  Lungs: clear to ascultation Cor:  RRR Abdomen:  soft, nontender, nondistended. Ex:  no cords, erythema Pelvic:   Vulva: no masses, no atrophy, no lesions Vagina: no tenderness,  no erythema, no abnormal vaginal discharge, no vesicle(s) or ulcers, no cystocele, no rectocele Cervix: grossly normal, no discharge, no cervical motion tenderness, sample taken for a Pap smear Uterus: midline, no uterine prolapse, non-tender, enlarged (back to 19 weeks, fills pelvis), contour irregular, fibroids Bladder/Urethra: normal meatus, no urethral discharge, no urethral mass, bladder non distended, Urethra well supported Adnexa/Parametria: no parametrial tenderness, no parametrial mass, no adnexal tenderness, no ovarian mass   A:  For robotic TLH/salpingectomies/cystoscopy. [facility] entry at [facility] point.  The pt wishes to retain her ovaries unless frankly abnormal...   P: P: All risks, benefits and alternatives d/w patient and she desires to proceed.  Patient has undergone an ERAS protocol and will receive preop antibiotics and SCDs during the operation.   Pt to have extended recovery but will go home same day if eating, ambulating, voiding and pain control is good.  Anna Mclean

## 2023-09-30 ENCOUNTER — Encounter (HOSPITAL_COMMUNITY): Payer: Self-pay | Admitting: Obstetrics and Gynecology

## 2023-10-01 ENCOUNTER — Encounter (HOSPITAL_COMMUNITY): Payer: Self-pay | Admitting: Obstetrics and Gynecology

## 2023-10-01 NOTE — Pre-Procedure Instructions (Signed)
 Surgical Instructions   Your procedure is scheduled on :  Wednesday,  10-07-2023. Report to [facility] Main Entrance A at 5:30  A.M., then check in with the Admitting office. Any questions or running late day of surgery: call [phone]  Questions prior to your surgery date: call [phone], Monday-Friday, 8am-4pm. If you experience any cold or flu symptoms such as cough, fever, chills, shortness of breath, etc. between now and your scheduled surgery, please notify your surgeon office.    Remember:  Do not eat any food and do not drink any liquids after midnight the night before your surgery.  This includes no water,  candy,  gum,  and  mints.    Take these medicines the morning of surgery with A SIP OF WATER :  NONE   May take these medicines IF NEEDED: NONE    One week prior to surgery, STOP taking any Aspirin (unless otherwise instructed by your surgeon) Aleve, Naproxen, Ibuprofen, Motrin, Advil, Goody's, BC's, all herbal medications, fish oil, and non-prescription vitamins.                     Do NOT Smoke (Tobacco/Vaping) and Do Not drink alcohol for 24 hours prior to your procedure.  If you use a CPAP at night, you may bring your mask/headgear for your overnight stay.   You will be asked to remove any contacts, glasses, piercing's, hearing aid's, dentures/partials prior to surgery. Please bring cases/ contact solution/ etc., for these items day of surgery.   Patients discharged the day of surgery will not be allowed to drive home, and someone needs to stay with them for 24 hours.  SURGICAL WAITING ROOM VISITATION Patients may have no more than 2 support people in the waiting area - these visitors may rotate.   Pre-op nurse will coordinate an appropriate time for 1 ADULT support person, who may not rotate, to accompany patient in pre-op.  Children under the age of 33 must have an adult with them who is not the patient and must remain in the main waiting area with an  adult.  If the patient needs to stay at the hospital during part of their recovery, the visitor guidelines for inpatient rooms apply.  Please refer to the [facility] website for the visitor guidelines for any additional information.   If you received a COVID test during your pre-op visit  it is requested that you wear a mask when out in public, stay away from anyone that may not be feeling well and notify your surgeon if you develop symptoms. If you have been in contact with anyone that has tested positive in the last 10 days please notify you surgeon.      Pre-operative CHG Bathing Instructions   You can play a key role in reducing the risk of infection after surgery. Your skin needs to be as free of germs as possible. You can reduce the number of germs on your skin by washing with CHG (chlorhexidine gluconate) soap before surgery. CHG is an antiseptic soap that kills germs and continues to kill germs even after washing.   DO NOT use if you have an allergy to chlorhexidine/CHG or antibacterial soaps. If your skin becomes reddened or irritated, stop using the CHG and notify one of our RN day of surgery.             TAKE A SHOWER THE NIGHT BEFORE SURGERY AND THE DAY OF SURGERY    Please keep in mind  the following:  DO NOT shave, including legs and genital area, 48 hours prior to surgery.   You may shave your face before/day of surgery.  Place clean sheets on your bed the night before surgery Use a clean washcloth (not used since being washed) for each shower. DO NOT sleep with pet's night before surgery.  CHG Shower Instructions:  Wash your face and private area with normal soap. If you choose to wash your hair, wash first with your normal shampoo.  After you use shampoo/soap, rinse your hair and body thoroughly to remove shampoo/soap residue.  Turn the water OFF and apply half the bottle of CHG soap to a CLEAN washcloth.  Apply CHG soap ONLY FROM YOUR NECK DOWN TO YOUR TOES (washing  for 3-5 minutes)  DO NOT use CHG soap on face, private areas, open wounds, or sores.  Pay special attention to the area where your surgery is being performed.  If you are having back surgery, having someone wash your back for you may be helpful. Wait 2 minutes after CHG soap is applied, then you may rinse off the CHG soap.  Pat dry with a clean towel  Put on clean pajamas    Additional instructions for the day of surgery: DO NOT APPLY any lotions,  powder,  oils,  deodorants (may use underarm deodorant) , cologne/ perfumes  or makeup  Do not wear jewelry /  piercing's/ metal/  permanent jewelry must be removed prior to arrival day of surgery.  (No plastic piercing) Do not wear nail polish, gel polish, artificial nails, or any other type of covering on natural finger nails (toe nails are okay) Do not bring valuables to the hospital. [facility] is not responsible for valuables/personal belongings. Put on clean/comfortable clothes.  Please brush your teeth and rinse mouth out. Ask your nurse before applying any prescription medications to the skin.

## 2023-10-01 NOTE — Progress Notes (Signed)
 Spoke w/ via phone for pre-op interview--- pt Lab needs dos---- no  (pt is postmenopausl, no upt needed)        Lab results------ lab appt 10-02-2023 @ 1030 gettting CBC/ T&S COVID test -----patient states asymptomatic no test needed Arrive at -------  0530 on 10-07-2023 NPO after MN w/ exception sips of water w/ meds Pre-Surgery Ensure or G2: n/a  Med rec completed Medications to take morning of surgery ----- none Diabetic medication ----- n/a  GLP1 agonist last dose: n/a GLP1 instructions:  Patient instructed no nail polish to be worn day of surgery Patient instructed to bring photo id and insurance card day of surgery Patient aware to have Driver (ride ) / caregiver    for 24 hours after surgery - sister, stephanie bass Patient Special Instructions ----- will pick up soap and written instructions at lab appt Pre-Op special Instructions ----- n/a  Patient verbalized understanding of instructions that were given at this phone interview. Patient denies chest pain, sob, fever, cough at the interview.

## 2023-10-02 ENCOUNTER — Encounter (HOSPITAL_COMMUNITY)
Admission: RE | Admit: 2023-10-02 | Discharge: 2023-10-02 | Disposition: A | Discharge status: Home / Self Care | Payer: Self-pay | Source: Ambulatory Visit | Attending: Obstetrics and Gynecology | Admitting: Obstetrics and Gynecology

## 2023-10-02 DIAGNOSIS — Z01818 Encounter for other preprocedural examination: Secondary | ICD-10-CM | POA: Insufficient documentation

## 2023-10-02 LAB — CBC
HCT: 38.8 % (ref 36.0–46.0)
Hemoglobin: 12.8 g/dL (ref 12.0–15.0)
MCH: 32.2 pg (ref 26.0–34.0)
MCHC: 33 g/dL (ref 30.0–36.0)
MCV: 97.5 fL (ref 80.0–100.0)
Platelets: 225 K/uL (ref 150–400)
RBC: 3.98 MIL/uL (ref 3.87–5.11)
RDW: 12.1 % (ref 11.5–15.5)
WBC: 7.4 K/uL (ref 4.0–10.5)
nRBC: 0 % (ref 0.0–0.2)

## 2023-10-02 LAB — TYPE AND SCREEN
ABO/RH(D): O POS
Antibody Screen: NEGATIVE

## 2023-10-06 NOTE — Anesthesia Preprocedure Evaluation (Signed)
 Anesthesia Evaluation  Patient identified by MRN, date of birth, ID band Patient awake    Reviewed: Allergy & Precautions, H&P , NPO status , Patient's Chart, lab work & pertinent test results  Airway Mallampati: II  TM Distance: >3 FB Neck ROM: Full    Dental no notable dental hx.    Pulmonary neg pulmonary ROS   Pulmonary exam normal breath sounds clear to auscultation       Cardiovascular negative cardio ROS Normal cardiovascular exam Rhythm:Regular Rate:Normal     Neuro/Psych  PSYCHIATRIC DISORDERS  Depression    negative neurological ROS     GI/Hepatic negative GI ROS, Neg liver ROS,GERD  ,,  Endo/Other  negative endocrine ROS    Renal/GU negative Renal ROS   Uterine leiomyoma negative genitourinary   Musculoskeletal negative musculoskeletal ROS (+)    Abdominal   Peds negative pediatric ROS (+)  Hematology  (+) Blood dyscrasia, anemia   Anesthesia Other Findings   Reproductive/Obstetrics negative OB ROS                              Anesthesia Physical Anesthesia Plan  ASA: 2  Anesthesia Plan: General   Post-op Pain Management: Tylenol  PO (pre-op)*   Induction:   PONV Risk Score and Plan: 3 and Ondansetron , Dexamethasone , TIVA and Treatment may vary due to age or medical condition  Airway Management Planned: Oral ETT  Additional Equipment: None  Intra-op Plan:   Post-operative Plan: Extubation in OR  Informed Consent: I have reviewed the patients History and Physical, chart, labs and discussed the procedure including the risks, benefits and alternatives for the proposed anesthesia with the patient or authorized representative who has indicated his/her understanding and acceptance.       Plan Discussed with: CRNA  Anesthesia Plan Comments:          Anesthesia Quick Evaluation

## 2023-10-07 ENCOUNTER — Encounter (HOSPITAL_COMMUNITY)
Admission: RE | Disposition: A | Discharge status: Home / Self Care | Payer: Self-pay | Source: Home / Self Care | Attending: Obstetrics and Gynecology

## 2023-10-07 ENCOUNTER — Other Ambulatory Visit: Payer: Self-pay

## 2023-10-07 ENCOUNTER — Ambulatory Visit (HOSPITAL_COMMUNITY): Payer: Self-pay

## 2023-10-07 ENCOUNTER — Encounter (HOSPITAL_COMMUNITY): Payer: Self-pay | Admitting: Obstetrics and Gynecology

## 2023-10-07 ENCOUNTER — Ambulatory Visit (HOSPITAL_COMMUNITY)
Admission: RE | Admit: 2023-10-07 | Discharge: 2023-10-08 | Disposition: A | Discharge status: Home / Self Care | Payer: Self-pay | Attending: Obstetrics and Gynecology | Admitting: Obstetrics and Gynecology

## 2023-10-07 DIAGNOSIS — Y658 Other specified misadventures during surgical and medical care: Secondary | ICD-10-CM | POA: Insufficient documentation

## 2023-10-07 DIAGNOSIS — Z9889 Other specified postprocedural states: Secondary | ICD-10-CM

## 2023-10-07 DIAGNOSIS — N9971 Accidental puncture and laceration of a genitourinary system organ or structure during a genitourinary system procedure: Secondary | ICD-10-CM | POA: Insufficient documentation

## 2023-10-07 DIAGNOSIS — Z01818 Encounter for other preprocedural examination: Secondary | ICD-10-CM

## 2023-10-07 DIAGNOSIS — D259 Leiomyoma of uterus, unspecified: Secondary | ICD-10-CM

## 2023-10-07 DIAGNOSIS — N888 Other specified noninflammatory disorders of cervix uteri: Secondary | ICD-10-CM | POA: Insufficient documentation

## 2023-10-07 DIAGNOSIS — D649 Anemia, unspecified: Secondary | ICD-10-CM | POA: Insufficient documentation

## 2023-10-07 HISTORY — DX: Unspecified hemorrhoids: K64.9

## 2023-10-07 HISTORY — DX: Gastro-esophageal reflux disease without esophagitis: K21.9

## 2023-10-07 HISTORY — PX: CYSTOSCOPY: SHX5120

## 2023-10-07 HISTORY — DX: Dermatitis, unspecified: L30.9

## 2023-10-07 HISTORY — PX: ROBOTIC ASSISTED TOTAL HYSTERECTOMY WITH BILATERAL SALPINGO OOPHERECTOMY: SHX6086

## 2023-10-07 HISTORY — DX: Personal history of other diseases of the female genital tract: Z87.42

## 2023-10-07 HISTORY — PX: BLADDER REPAIR: SHX6721

## 2023-10-07 HISTORY — DX: Postmenopausal bleeding: N95.0

## 2023-10-07 HISTORY — DX: Presence of spectacles and contact lenses: Z97.3

## 2023-10-07 LAB — ABO/RH: ABO/RH(D): O POS

## 2023-10-07 SURGERY — HYSTERECTOMY, TOTAL, ROBOT-ASSISTED, LAPAROSCOPIC, WITH BILATERAL SALPINGO-OOPHORECTOMY
Anesthesia: General | Site: Pelvis

## 2023-10-07 MED ORDER — ONDANSETRON HCL 4 MG/2ML IJ SOLN
INTRAMUSCULAR | Status: DC | PRN
  Administered 2023-10-07: 4 mg via INTRAVENOUS

## 2023-10-07 MED ORDER — MENTHOL 3 MG MT LOZG: 1.0000 | LOZENGE | OROMUCOSAL | Status: DC | PRN

## 2023-10-07 MED ORDER — ROCURONIUM BROMIDE 10 MG/ML (PF) SYRINGE
PREFILLED_SYRINGE | INTRAVENOUS | Status: DC | PRN
  Administered 2023-10-07: 20 mg via INTRAVENOUS
  Administered 2023-10-07: 30 mg via INTRAVENOUS
  Administered 2023-10-07: 20 mg via INTRAVENOUS
  Administered 2023-10-07: 40 mg via INTRAVENOUS
  Administered 2023-10-07: 20 mg via INTRAVENOUS
  Administered 2023-10-07: 15 mg via INTRAVENOUS
  Administered 2023-10-07: 20 mg via INTRAVENOUS

## 2023-10-07 MED ORDER — GABAPENTIN 300 MG PO CAPS
ORAL_CAPSULE | ORAL | Status: AC
  Filled 2023-10-07: qty 1

## 2023-10-07 MED ORDER — DROPERIDOL 2.5 MG/ML IJ SOLN: 0.6250 mg | Freq: Once | INTRAMUSCULAR | Status: DC | PRN

## 2023-10-07 MED ORDER — SOD CITRATE-CITRIC ACID 500-334 MG/5ML PO SOLN: 30.0000 mL | ORAL | Status: DC

## 2023-10-07 MED ORDER — MIDAZOLAM HCL 2 MG/2ML IJ SOLN
INTRAMUSCULAR | Status: AC
  Filled 2023-10-07: qty 2

## 2023-10-07 MED ORDER — ONDANSETRON HCL 4 MG PO TABS: 4.0000 mg | ORAL_TABLET | Freq: Four times a day (QID) | ORAL | Status: DC | PRN

## 2023-10-07 MED ORDER — CELECOXIB 200 MG PO CAPS
400.0000 mg | ORAL_CAPSULE | ORAL | Status: AC
  Administered 2023-10-07: 400 mg via ORAL

## 2023-10-07 MED ORDER — HYDROMORPHONE HCL 1 MG/ML IJ SOLN
INTRAMUSCULAR | Status: DC | PRN
  Administered 2023-10-07: .5 mg via INTRAVENOUS

## 2023-10-07 MED ORDER — LACTATED RINGERS IV SOLN: INTRAVENOUS | Status: DC

## 2023-10-07 MED ORDER — ACETAMINOPHEN 500 MG PO TABS
1000.0000 mg | ORAL_TABLET | ORAL | Status: AC
  Administered 2023-10-07: 500 mg via ORAL

## 2023-10-07 MED ORDER — DEXAMETHASONE SODIUM PHOSPHATE 10 MG/ML IJ SOLN
INTRAMUSCULAR | Status: DC | PRN
  Administered 2023-10-07: 10 mg via INTRAVENOUS

## 2023-10-07 MED ORDER — SODIUM CHLORIDE 0.9 % IV SOLN
INTRAVENOUS | Status: DC | PRN
  Administered 2023-10-07: 60 mL

## 2023-10-07 MED ORDER — PROPOFOL 10 MG/ML IV BOLUS
INTRAVENOUS | Status: DC | PRN
Start: 2023-10-07 — End: 2023-10-07
  Administered 2023-10-07: 20 mg via INTRAVENOUS
  Administered 2023-10-07: 70 mg via INTRAVENOUS

## 2023-10-07 MED ORDER — GABAPENTIN 300 MG PO CAPS
300.0000 mg | ORAL_CAPSULE | ORAL | Status: AC
  Administered 2023-10-07: 300 mg via ORAL

## 2023-10-07 MED ORDER — FLUORESCEIN SODIUM 10 % IV SOLN
INTRAVENOUS | Status: AC
  Filled 2023-10-07: qty 10

## 2023-10-07 MED ORDER — LABETALOL HCL 5 MG/ML IV SOLN: 5.0000 mg | INTRAVENOUS | Status: DC | PRN

## 2023-10-07 MED ORDER — ORAL CARE MOUTH RINSE: 15.0000 mL | Freq: Once | OROMUCOSAL | Status: AC

## 2023-10-07 MED ORDER — OXYCODONE HCL 5 MG PO TABS: 5.0000 mg | ORAL_TABLET | Freq: Once | ORAL | Status: DC | PRN

## 2023-10-07 MED ORDER — BUPIVACAINE HCL (PF) 0.25 % IJ SOLN
INTRAMUSCULAR | Status: AC
  Filled 2023-10-07: qty 60

## 2023-10-07 MED ORDER — DOCUSATE SODIUM 100 MG PO CAPS
100.0000 mg | ORAL_CAPSULE | Freq: Two times a day (BID) | ORAL | Status: DC
  Administered 2023-10-07: 100 mg via ORAL
  Filled 2023-10-07: qty 1

## 2023-10-07 MED ORDER — LIDOCAINE 2% (20 MG/ML) 5 ML SYRINGE
INTRAMUSCULAR | Status: AC
  Filled 2023-10-07: qty 5

## 2023-10-07 MED ORDER — GLYCOPYRROLATE PF 0.2 MG/ML IJ SOSY
PREFILLED_SYRINGE | INTRAMUSCULAR | Status: DC | PRN
  Administered 2023-10-07 (×3): .1 mg via INTRAVENOUS

## 2023-10-07 MED ORDER — CHLORHEXIDINE GLUCONATE CLOTH 2 % EX PADS
6.0000 | MEDICATED_PAD | Freq: Every day | CUTANEOUS | Status: DC
  Administered 2023-10-07 – 2023-10-08 (×2): 6 via TOPICAL

## 2023-10-07 MED ORDER — OXYCODONE HCL 5 MG PO TABS: 5.0000 mg | ORAL_TABLET | ORAL | Status: DC | PRN

## 2023-10-07 MED ORDER — HYDROMORPHONE HCL 1 MG/ML IJ SOLN
INTRAMUSCULAR | Status: AC
  Filled 2023-10-07: qty 0.5

## 2023-10-07 MED ORDER — SODIUM CHLORIDE 0.9 % IV SOLN: INTRAVENOUS | Status: DC | PRN

## 2023-10-07 MED ORDER — SODIUM CHLORIDE (PF) 0.9 % IJ SOLN
INTRAMUSCULAR | Status: AC
  Filled 2023-10-07: qty 50

## 2023-10-07 MED ORDER — DEXAMETHASONE SODIUM PHOSPHATE 10 MG/ML IJ SOLN
INTRAMUSCULAR | Status: AC
  Filled 2023-10-07: qty 1

## 2023-10-07 MED ORDER — ROPIVACAINE HCL 5 MG/ML IJ SOLN
INTRAMUSCULAR | Status: AC
  Filled 2023-10-07: qty 60

## 2023-10-07 MED ORDER — MIRABEGRON ER 25 MG PO TB24
25.0000 mg | ORAL_TABLET | Freq: Every day | ORAL | Status: DC
  Administered 2023-10-07 – 2023-10-08 (×2): 25 mg via ORAL
  Filled 2023-10-07 (×2): qty 1

## 2023-10-07 MED ORDER — FENTANYL CITRATE (PF) 250 MCG/5ML IJ SOLN
INTRAMUSCULAR | Status: DC | PRN
  Administered 2023-10-07: 25 ug via INTRAVENOUS
  Administered 2023-10-07 (×3): 75 ug via INTRAVENOUS

## 2023-10-07 MED ORDER — CELECOXIB 200 MG PO CAPS
ORAL_CAPSULE | ORAL | Status: AC
  Filled 2023-10-07: qty 2

## 2023-10-07 MED ORDER — POVIDONE-IODINE 10 % EX SWAB
2.0000 | Freq: Once | CUTANEOUS | Status: AC
  Administered 2023-10-07: 2 via TOPICAL

## 2023-10-07 MED ORDER — GLYCOPYRROLATE PF 0.2 MG/ML IJ SOSY
PREFILLED_SYRINGE | INTRAMUSCULAR | Status: AC
  Filled 2023-10-07: qty 2

## 2023-10-07 MED ORDER — PROPOFOL 1000 MG/100ML IV EMUL
INTRAVENOUS | Status: AC
  Filled 2023-10-07: qty 100

## 2023-10-07 MED ORDER — HYDROMORPHONE HCL 1 MG/ML IJ SOLN: 0.2000 mg | INTRAMUSCULAR | Status: DC | PRN

## 2023-10-07 MED ORDER — IBUPROFEN 800 MG PO TABS
800.0000 mg | ORAL_TABLET | Freq: Three times a day (TID) | ORAL | Status: DC
Start: 2023-10-07 — End: 2023-10-10
  Administered 2023-10-07 – 2023-10-08 (×3): 800 mg via ORAL
  Filled 2023-10-07 (×3): qty 1

## 2023-10-07 MED ORDER — OXYCODONE HCL 5 MG/5ML PO SOLN: 5.0000 mg | Freq: Once | ORAL | Status: DC | PRN

## 2023-10-07 MED ORDER — FENTANYL CITRATE (PF) 250 MCG/5ML IJ SOLN
INTRAMUSCULAR | Status: AC
  Filled 2023-10-07: qty 5

## 2023-10-07 MED ORDER — ONDANSETRON HCL 4 MG/2ML IJ SOLN
INTRAMUSCULAR | Status: AC
  Filled 2023-10-07: qty 2

## 2023-10-07 MED ORDER — SODIUM CHLORIDE 0.9% FLUSH
INTRAVENOUS | Status: DC | PRN
  Administered 2023-10-07: 10 mL via INTRAVENOUS

## 2023-10-07 MED ORDER — LABETALOL HCL 5 MG/ML IV SOLN
INTRAVENOUS | Status: AC
  Filled 2023-10-07: qty 4

## 2023-10-07 MED ORDER — CEFAZOLIN SODIUM-DEXTROSE 2-4 GM/100ML-% IV SOLN
2.0000 g | INTRAVENOUS | Status: AC
  Administered 2023-10-07 (×2): 2 g via INTRAVENOUS

## 2023-10-07 MED ORDER — HEMOSTATIC AGENTS (NO CHARGE) OPTIME
TOPICAL | Status: DC | PRN
  Administered 2023-10-07: 1

## 2023-10-07 MED ORDER — PHENYLEPHRINE 80 MCG/ML (10ML) SYRINGE FOR IV PUSH (FOR BLOOD PRESSURE SUPPORT)
PREFILLED_SYRINGE | INTRAVENOUS | Status: AC
  Filled 2023-10-07: qty 10

## 2023-10-07 MED ORDER — ONDANSETRON HCL 4 MG/2ML IJ SOLN: 4.0000 mg | Freq: Four times a day (QID) | INTRAMUSCULAR | Status: DC | PRN

## 2023-10-07 MED ORDER — BUPIVACAINE HCL 0.25 % IJ SOLN
INTRAMUSCULAR | Status: DC | PRN
  Administered 2023-10-07: 20 mL

## 2023-10-07 MED ORDER — 0.9 % SODIUM CHLORIDE (POUR BTL) OPTIME
TOPICAL | Status: DC | PRN
  Administered 2023-10-07: 1000 mL

## 2023-10-07 MED ORDER — CEFAZOLIN SODIUM-DEXTROSE 2-4 GM/100ML-% IV SOLN
INTRAVENOUS | Status: AC
  Filled 2023-10-07: qty 100

## 2023-10-07 MED ORDER — PROPOFOL 10 MG/ML IV BOLUS
INTRAVENOUS | Status: AC
  Filled 2023-10-07: qty 20

## 2023-10-07 MED ORDER — SODIUM CHLORIDE 0.9 % IR SOLN
Status: DC | PRN
  Administered 2023-10-07: 1000 mL

## 2023-10-07 MED ORDER — CHLORHEXIDINE GLUCONATE 0.12 % MT SOLN
15.0000 mL | Freq: Once | OROMUCOSAL | Status: AC
  Administered 2023-10-07: 15 mL via OROMUCOSAL

## 2023-10-07 MED ORDER — LABETALOL HCL 5 MG/ML IV SOLN
5.0000 mg | INTRAVENOUS | Status: DC | PRN
  Administered 2023-10-07 (×2): 5 mg via INTRAVENOUS
  Filled 2023-10-07 (×2): qty 4

## 2023-10-07 MED ORDER — FLUORESCEIN SODIUM 10 % IV SOLN
INTRAVENOUS | Status: DC | PRN
  Administered 2023-10-07: 25 mg via INTRAVENOUS

## 2023-10-07 MED ORDER — FENTANYL CITRATE (PF) 100 MCG/2ML IJ SOLN: 25.0000 ug | INTRAMUSCULAR | Status: DC | PRN

## 2023-10-07 MED ORDER — LIDOCAINE 2% (20 MG/ML) 5 ML SYRINGE
INTRAMUSCULAR | Status: DC | PRN
  Administered 2023-10-07: 60 mg via INTRAVENOUS

## 2023-10-07 MED ORDER — PANTOPRAZOLE SODIUM 40 MG PO TBEC
40.0000 mg | DELAYED_RELEASE_TABLET | Freq: Every day | ORAL | Status: DC
  Administered 2023-10-07 – 2023-10-08 (×2): 40 mg via ORAL
  Filled 2023-10-07 (×2): qty 1

## 2023-10-07 MED ORDER — ROCURONIUM BROMIDE 10 MG/ML (PF) SYRINGE
PREFILLED_SYRINGE | INTRAVENOUS | Status: AC
  Filled 2023-10-07: qty 10

## 2023-10-07 MED ORDER — SUGAMMADEX SODIUM 200 MG/2ML IV SOLN
INTRAVENOUS | Status: DC | PRN
  Administered 2023-10-07: 200 mg via INTRAVENOUS

## 2023-10-07 MED ORDER — FLUORESCEIN SODIUM 10 % IV SOLN
INTRAVENOUS | Status: AC
  Filled 2023-10-07: qty 5

## 2023-10-07 MED ORDER — PROPOFOL 500 MG/50ML IV EMUL
INTRAVENOUS | Status: DC | PRN
  Administered 2023-10-07: 125 ug/kg/min via INTRAVENOUS

## 2023-10-07 MED ORDER — CEFAZOLIN SODIUM 1 G IJ SOLR
INTRAMUSCULAR | Status: AC
  Filled 2023-10-07: qty 20

## 2023-10-07 MED ORDER — DEXMEDETOMIDINE HCL IN NACL 80 MCG/20ML IV SOLN
INTRAVENOUS | Status: AC
  Filled 2023-10-07: qty 20

## 2023-10-07 MED ORDER — DEXMEDETOMIDINE HCL IN NACL 80 MCG/20ML IV SOLN
INTRAVENOUS | Status: DC | PRN
  Administered 2023-10-07: 4 ug via INTRAVENOUS
  Administered 2023-10-07 (×2): 8 ug via INTRAVENOUS

## 2023-10-07 MED ORDER — CHLORHEXIDINE GLUCONATE 0.12 % MT SOLN
OROMUCOSAL | Status: AC
  Filled 2023-10-07: qty 15

## 2023-10-07 MED ORDER — MIDAZOLAM HCL 2 MG/2ML IJ SOLN
INTRAMUSCULAR | Status: DC | PRN
  Administered 2023-10-07: 2 mg via INTRAVENOUS

## 2023-10-07 MED ORDER — ACETAMINOPHEN 500 MG PO TABS
ORAL_TABLET | ORAL | Status: AC
  Filled 2023-10-07: qty 2

## 2023-10-07 SURGICAL SUPPLY — 66 items
APPLICATOR ARISTA FLEXITIP XL (MISCELLANEOUS) IMPLANT
BAG URINE DRAIN 2000ML AR STRL (UROLOGICAL SUPPLIES) IMPLANT
BARRIER ADHS 3X4 INTERCEED (GAUZE/BANDAGES/DRESSINGS) IMPLANT
CANISTER SUCTION 3000ML PPV (SUCTIONS) ×2 IMPLANT
CATH FOLEY 2WAY SLVR 5CC 12FR (CATHETERS) IMPLANT
CATH FOLEY 2WAY SLVR 5CC 16FR (CATHETERS) IMPLANT
CATH URETL OPEN END 6FR 70 (CATHETERS) IMPLANT
COUNTER NDL 20CT MAGNET RED (NEEDLE) IMPLANT
COVER BACK TABLE 60X90IN (DRAPES) ×2 IMPLANT
COVER MAYO STAND STRL (DRAPES) ×2 IMPLANT
COVER TIP SHEARS 8 DVNC (MISCELLANEOUS) ×2 IMPLANT
DEFOGGER SCOPE WARM SEASHARP (MISCELLANEOUS) ×2 IMPLANT
DERMABOND ADVANCED .7 DNX12 (GAUZE/BANDAGES/DRESSINGS) ×2 IMPLANT
DRAPE ARM DVNC X/XI (DISPOSABLE) ×8 IMPLANT
DRAPE COLUMN DVNC XI (DISPOSABLE) ×2 IMPLANT
DRAPE SURG IRRIG POUCH 19X23 (DRAPES) ×2 IMPLANT
DRAPE UTILITY XL STRL (DRAPES) ×2 IMPLANT
DRIVER NDL MEGA SUTCUT DVNCXI (INSTRUMENTS) ×2 IMPLANT
DRIVER NDLE MEGA SUTCUT DVNCXI (INSTRUMENTS) ×2 IMPLANT
DURAPREP 26ML APPLICATOR (WOUND CARE) ×2 IMPLANT
ELECTRODE REM PT RTRN 9FT ADLT (ELECTROSURGICAL) ×2 IMPLANT
FORCEPS BPLR FENES DVNC XI (FORCEP) ×2 IMPLANT
FORCEPS PROGRASP DVNC XI (FORCEP) IMPLANT
FORCEPS TENACULUM DVNC XI (FORCEP) IMPLANT
GAUZE 4X4 16PLY ~~LOC~~+RFID DBL (SPONGE) IMPLANT
GLOVE BIO SURGEON STRL SZ7 (GLOVE) ×6 IMPLANT
GLOVE BIOGEL PI IND STRL 7.0 (GLOVE) ×4 IMPLANT
GUIDEWIRE STRT TIP .038X150X3 (WIRE) IMPLANT
HEMOSTAT ARISTA ABSORB 3G PWDR (HEMOSTASIS) IMPLANT
HIBICLENS CHG 4% 4OZ BTL (MISCELLANEOUS) ×2 IMPLANT
IRRIGATION STRYKERFLOW (MISCELLANEOUS) ×2 IMPLANT
IV NS 1000ML BAXH (IV SOLUTION) IMPLANT
KIT PINK PAD W/HEAD ARM REST (MISCELLANEOUS) ×2 IMPLANT
LEGGING LITHOTOMY PAIR STRL (DRAPES) ×2 IMPLANT
MANIPULATOR ADVINCU DEL 2.5 PL (MISCELLANEOUS) IMPLANT
MANIPULATOR ADVINCU DEL 3.0 PL (MISCELLANEOUS) IMPLANT
MANIPULATOR ADVINCU DEL 3.5 PL (MISCELLANEOUS) IMPLANT
MANIPULATOR ADVINCU DEL 4.0 PL (MISCELLANEOUS) IMPLANT
MARKER SKIN DUAL TIP RULER LAB (MISCELLANEOUS) IMPLANT
NDL INSUFFLATION 14GA 120MM (NEEDLE) ×2 IMPLANT
NEEDLE INSUFFLATION 14GA 120MM (NEEDLE) ×2 IMPLANT
NS IRRIG 1000ML POUR BTL (IV SOLUTION) IMPLANT
OBTURATOR OPTICALSTD 8 DVNC (TROCAR) ×2 IMPLANT
PACK ROBOT WH (CUSTOM PROCEDURE TRAY) ×2 IMPLANT
PACK ROBOTIC GOWN (GOWN DISPOSABLE) ×2 IMPLANT
PAD OB MATERNITY 11 LF (PERSONAL CARE ITEMS) ×2 IMPLANT
POUCH LAPAROSCOPIC INSTRUMENT (MISCELLANEOUS) IMPLANT
SCISSORS MNPLR CVD DVNC XI (INSTRUMENTS) ×2 IMPLANT
SEAL UNIV 5-12 XI (MISCELLANEOUS) ×6 IMPLANT
SET IRRIG Y TYPE TUR BLADDER L (SET/KITS/TRAYS/PACK) ×2 IMPLANT
SET TRI-LUMEN FLTR TB AIRSEAL (TUBING) ×2 IMPLANT
SOLN STERILE WATER 1000 ML (IV SOLUTION) ×2 IMPLANT
SOLN STERILE WATER BTL 1000 ML (IV SOLUTION) ×2 IMPLANT
SUT SILK 2 0 SH (SUTURE) IMPLANT
SUT VIC AB 0 CT1 18XCR BRD8 (SUTURE) IMPLANT
SUT VIC AB 0 CT1 36 (SUTURE) ×2 IMPLANT
SUT VIC AB 2-0 CT1 TAPERPNT 27 (SUTURE) IMPLANT
SUT VIC AB 2-0 SH 27XBRD (SUTURE) IMPLANT
SUT VICRYL RAPIDE 3 0 (SUTURE) ×4 IMPLANT
SUT VLOC 180 0 9IN GS21 (SUTURE) ×2 IMPLANT
TOWEL GREEN STERILE (TOWEL DISPOSABLE) ×2 IMPLANT
TRAY FOLEY W/BAG SLVR 14FR (SET/KITS/TRAYS/PACK) ×2 IMPLANT
TROCAR PORT AIRSEAL 5X120 (TROCAR) ×2 IMPLANT
TUBE CONNECTING 12X1/4 (SUCTIONS) IMPLANT
TUBING TUR DISP (UROLOGICAL SUPPLIES) ×2 IMPLANT
UNDERPAD 30X36 HEAVY ABSORB (UNDERPADS AND DIAPERS) ×2 IMPLANT

## 2023-10-07 NOTE — Progress Notes (Signed)
 FABIENE Bend, RN paged urologist on call for consult.

## 2023-10-07 NOTE — Progress Notes (Signed)
 Dr. Renda called in to room.  Spoke with Dr. Sarrah and is on his way.

## 2023-10-07 NOTE — Progress Notes (Signed)
 Patient ID: Anna Mclean, female   DOB: 1960/05/09, 63 y.o.   MRN: [id number]  She is doing well postoperatively.  We discussed her bladder injury and repair and all questions were answered.  Plan to leave indwelling catheter for about 10 days and then get a cystogram and outpatient follow up in our office which I will arrange.

## 2023-10-07 NOTE — Progress Notes (Signed)
 Saw patient postop.  Explained bladder injury complication and resolution.  Explained postop course and expectations.  Pt and sister voiced understanding and questions answered.  Pt doing well- some chest tightness and BP being treated.  Vitals:   10/07/23 1417 10/07/23 1430 10/07/23 1518 10/07/23 1641  BP: (!) 152/91 (!) 166/102 (!) 166/81 (!) 164/81  Pulse: 82 83 84   Resp: 18 17    Temp:   97.7 F (36.5 C)   TempSrc:   Oral   SpO2: 99% 100% 100%   Weight:      Height:

## 2023-10-07 NOTE — Transfer of Care (Signed)
 Immediate Anesthesia Transfer of Care Note  Patient: Anna Mclean  Procedure(s) Performed: HYSTERECTOMY, TOTAL, ROBOT-ASSISTED, LAPAROSCOPIC, WITH BILATERAL SALPINGECTOMY (Pelvis) CYSTOSCOPY (Bladder) CYSTOSCOPY (Bladder) REPAIR, BLADDER/ BILATERAL URETERAL CATHETERIZATION (Bladder)  Patient Location: PACU  Anesthesia Type:General  Level of Consciousness: sedated, drowsy, and responds to stimulation  Airway & Oxygen Therapy: Patient Spontanous Breathing  Post-op Assessment: Report given to RN and Post -op Vital signs reviewed and stable  Post vital signs: Reviewed and stable  Last Vitals:  Vitals Value Taken Time  BP 125/73 10/07/23 13:15  Temp    Pulse 66 10/07/23 13:15  Resp 9 10/07/23 13:15  SpO2 99 % 10/07/23 13:15  Vitals shown include unfiled device data.  Last Pain:  Vitals:   10/07/23 0546  TempSrc: Oral  PainSc: 0-No pain      Patients Stated Pain Goal: 5 (10/07/23 0546)  Complications: No notable events documented.

## 2023-10-07 NOTE — Anesthesia Procedure Notes (Signed)
 Procedure Name: Intubation Date/Time: 10/07/2023 7:32 AM  Performed by: Mollie Olivia SAUNDERS, CRNAPre-anesthesia Checklist: Patient identified, Emergency Drugs available, Suction available and Patient being monitored Patient Re-evaluated:Patient Re-evaluated prior to induction Oxygen Delivery Method: Circle system utilized Preoxygenation: Pre-oxygenation with 100% oxygen Induction Type: IV induction Ventilation: Mask ventilation without difficulty Laryngoscope Size: Mac and 3 Grade View: Grade II Tube type: Oral Tube size: 7.0 mm Number of attempts: 1 Airway Equipment and Method: Stylet and Oral airway Placement Confirmation: ETT inserted through vocal cords under direct vision, positive ETCO2 and breath sounds checked- equal and bilateral Secured at: 21 cm Tube secured with: Tape Dental Injury: Teeth and Oropharynx as per pre-operative assessment  Comments: Sl anterior

## 2023-10-07 NOTE — Progress Notes (Signed)
 BP rechecked 164/81, verbal order for labetalol  5mg  IV for SBP >150

## 2023-10-07 NOTE — Progress Notes (Signed)
 There has been no change in the patients history, status or exam since the history and physical.  Vitals:   10/01/23 0922 10/07/23 0546  BP:  (!) 153/79  Pulse:  76  Resp:  17  Temp:  98.3 F (36.8 C)  TempSrc:  Oral  SpO2:  100%  Weight: 49 kg 49.7 kg  Height: 5' 1 (1.549 m) 5' 1 (1.549 m)    Results for orders placed or performed during the hospital encounter of 10/07/23 (from the past 72 hours)  ABO/Rh     Status: None (Preliminary result)   Collection Time: 10/07/23  6:26 AM  Result Value Ref Range   ABO/RH(D) PENDING     Anna Mclean

## 2023-10-07 NOTE — Progress Notes (Addendum)
 Contacted Dr. Sarrah about foley and BP.  As per MD, foley to stay in place, will be discharging home with it.  MD wants BP rechecked and call back with results

## 2023-10-07 NOTE — Op Note (Signed)
 10/07/2023  1:10 PM  PATIENT:  Anna Mclean  63 y.o. female  PRE-OPERATIVE DIAGNOSIS:  uterine leiomyoma  POST-OPERATIVE DIAGNOSIS:  uterine leiomyoma  PROCEDURE:  Procedure(s): HYSTERECTOMY, TOTAL, ROBOT-ASSISTED, LAPAROSCOPIC, WITH BILATERAL SALPINGECTOMY (N/A) CYSTOSCOPY (N/A) CYSTOSCOPY (N/A) REPAIR, BLADDER/ BILATERAL URETERAL CATHETERIZATION (N/A)  SURGEON:  Surgeons and Role: Panel 1:    * Sarrah Browning, MD - Primary      * Claire Rubie LABOR, MD - Assisting Panel 2:    DEWAINE Renda Glance, MD - Primary Sarrah armin Claire assisting  ANESTHESIA:   general  EBL:  200 mL   BLOOD ADMINISTERED:none  DRAINS: Urinary Catheter (Foley)   LOCAL MEDICATIONS USED:  MARCAINE , Ropivicaine, Arista, Fluoroscein  SPECIMEN:  Source of Specimen:  Uterus, fibroids, tubes, cervix  DISPOSITION OF SPECIMEN:  PATHOLOGY  COUNTS:  YES  TOURNIQUET:  * No tourniquets in log *  DICTATION: .Note written in EPIC  PLAN OF CARE: Admit for overnight observation  PATIENT DISPOSITION:  PACU - hemodynamically stable.   Delay start of Pharmacological VTE agent (>24hrs) due to surgical blood loss or risk of bleeding: not applicable  Complications:  Bladder cystotomy.  Findings:  21 weeks size uterus with multiple fibroids.  Ovaries were normal.  The ureters were identified during multiple points of the case and were always out of the field of dissection.  A 1.5 cm cystotomy was made just superior to the trigone. On cystoscopy the final time, the bladder was intact and bilateral spill was seen from each ureteral oriface.         Technique:   After adequate anesthesia was achieved the patient was positioned, prepped and draped in usual sterile fashion.  A speculum was placed in the vagina and the cervix dilated with pratt dilators.  The 3 cm Koh ring Advincula was assembled and placed in proper fashion.  The  Speculum was removed and the bladder catheterized with a foley.      Attention was turned to the abdomen where a 1 cm incision was made at [employer] point.  The veress needle was inserted without aspiration of bowel contents or blood.  The 5 mm airseal trocar was placed with optiview and the other three trocar sites were marked out, all approximately 10 cm from each other and below the airseal. An 8.5 mm trocar was introduced above the umbilicus and the other 8.5 mm trocars were placed on either side of the camera port.   All trocars were inserted under direct visualization of the camera.  The patient was placed in trendelenburg and then the Robot docked.  However, the L trocar was hitting the pt's arm.  A second site above that one was made with scalpel and the trocar reintroduced under direct visualization.  The fenestrated bipolar were placed on arm 1 and the Hot shears on arm 3 and introduced under direct visualization of the camera.   I then broke scrub and sat down at the console.  The above findings were noted and the ureters identified well out of the field of dissection.  The right fallopian tube was isolated and cauterized with the bipolar.  The Utero-ovarian ligament was then divided with the bipolar cautery and shears.  The posterior broad ligament was then divided with the hot shears until the uterosacral ligament.  The Broad and cardinal ligaments were then cauterized against the cervix to the level of the Koh ring, securing the uterine artery.  Each pedicle was then incised with the shears.  The anterior  leaf was then incised at the reflection of the vessico-uterine junction and the lateral bladder retracted inferiorly after the round ligament had been divided with the bipolar forceps.  The left tube was cauterized with the bipolar and divided with the shears;  then the left utero-ovarian ligament divided with the bipolar forceps and the scissors.  The round ligament was divided as well and the posterior leaf of the broad ligament then divided with the hot shears.  The broad and cardinal ligaments were then cauterized on the left in the same way being careful to see the ureters all the time.   At the level of the internal os, the uterine arteries were bilaterally cauterized with the bipolar.  The ureters were identified well out of the field of dissection.    Eight fibroids were removed at this point with sharp and blunt dissection with cautery.  The fibroids were placed in the R gutter.  The bladder was then able to be retracted inferiorly and the vesico-uterine fascia was incised in the midline until the bladder was removed one cm below the Koh ring.  The hot shears then circumferentially incised the vagina at the level of the reflection on the [facility] ring.  Once the uterus and cervix were amputated, cautery was used to insure hemostasis of the cuff.  Once the uterus was a reasonable size after removing fibroids, it was removed from the abdomen through the vaginal.  Subsequently, all fibroids were removed as well.   Once hemostasis was achieved, the scissors were changed to the mega suture cut needle driver and the cuff was closed with a running stitches of 0-vicryl V loc.  It was at this point I had difficulty finding the anterior vaginal cuff but felt that I had closed it correctly.  Cautery was used to ensure hemostasis of the left pedicles very superficially. The ureters were peristalsing bilaterally well and very lateral to the areas of operation.     The Robot was then undocked and I scrubbed back in.  The needle was removed and Ropivicaine was introduced into the pelvis. The skin incisions were closed with subcuticular stitches of 3-0 vicryl Rapide and Dermabond.  All instruments were removed from the vagina and cystoscopy performed.  At this point it was realized that the vaginal mucosa had split in the midline during the removal of the fibroids and uterus.  What I had thought was the vaginal mucosal edge was tissue deep behind the bladder.  Having closed this to  the posterior vaginal cuff edge, I created a 1.5 cm cystotomy.  Cystoscopy revealed that the hole with vaginal stitches was just superior to the trigone and the ureteral orifaces were clear and their was excellent spill from both.  I then took down the cuff stitches by cutting multiple sites of the v-loc at the cuff.  This exposed the defect in the bladder and released the edges of the bladder mucosa.    Urology was called for intra-op consult for help repairing the bladder.  Dr. Renda was able to come in and assess and agree to vaginal repair of the bladder.  His procedures are documented in his note.  The bladder was repaired to water-tight and I took over after he was finished.   I then closed the vaginal mucosa anteriorly in two layers; first, 4 mattress sutures of 0-vicryl were placed in the submucosal tissue to close it.  Then the mucosa was closed with 2-0 vicryl in a running lock fashion.  The  cuff was closed with figure of 8 stitches of 0-vicryl.    Cystoscopy again revealed an intact bladder now and vigourous spill of urine from each ureteral orifice.  Initially a single v-loc stitch was seen still in the bladder but on second scope I could not see it.  Either way Dr. Renda was ok with it being an absorbable stitch.  The cystoscope was removed and the patient taken to the recovery room in stable condition.   Connie Lasater A

## 2023-10-07 NOTE — Op Note (Addendum)
 Preoperative diagnosis: Bladder injury  Postoperative diagnosis: Bladder injury  Procedures: 1.  Cystoscopy 2.  Bilateral ureteral catheterization 3.  Transvaginal repair of bladder injury  Surgeon: Gretel CANDIE Renda Mickey MD  Anesthesia: General  Complications: None  EBL: Minimal  Specimens: None  Drains: None  Intraoperative findings: The patient was in lithotomy when I arrived with a vertical vaginal incision.  There was a defect posteriorly in the bladder and the bladder mucosa was exposed.  On cystoscopy, I was able to identify an injury to the bladder in the midline just posterior to the trigone.  Indication: Ms. Fant is a 63 year old female who underwent a robot-assisted laparoscopic hysterectomy for uterine leiomyoma disease.  She was noted intraoperatively to have a bladder injury and a urologic consultation was obtained.  Description of procedure: After arriving to the procedure, the patient was in dorsal lithotomy and the transabdominal robot-assisted laparoscopic portion of the procedure had ended.  I performed rigid cystoscopy with a 70 degree and 30 degree lens.  The ureteral orifice ease appeared patent with fluorescein  effluxing from each ureter.  I was able to identify a defect that measured approximately 2 cm x 2 cm in the midline just posterior to the trigone.  I then cannulated the right ureteral orifice with a 0.38 sensor guidewire and advanced a 6 French ureteral catheter over the wire.  I then performed an identical procedure on the contralateral side allowing a second 6 French ureteral catheter to be advanced over a wire into the left ureteral orifice.  These were brought out through the urethral meatus and secured to the sterile drape.  I then placed 2-0 silk holding sutures on the bladder mucosa transvaginally.  I then closed the bladder in 2 layers with a 2-0 Vicryl suture in a running vertical fashion as a mucosal layer and a second 2-0 Vicryl in a vertical  fashion as an imbricating muscular layer.  Using some of the vaginal muscle, I was able then to run a 2-0 Vicryl layer in a horizontal fashion with a goal of trying to avoid fistula development.  I then performed cystoscopy and filled the bladder.  There was no leak from the bladder.  The ureteral catheters were then removed under direct vision without difficulty and with further efflux of fluorescein  noted from each ureter indicating patency. A 16 Fr Foley catheter was then inserted into the bladder to remain indwelling. The procedure was then turned back over to Dr. Sarrah with plans to close the vaginal incision and at least 2 layers.

## 2023-10-07 NOTE — H&P (View-Only) (Signed)
 There has been no change in the patients history, status or exam since the history and physical.  Vitals:   10/01/23 0922 10/07/23 0546  BP:  (!) 153/79  Pulse:  76  Resp:  17  Temp:  98.3 F (36.8 C)  TempSrc:  Oral  SpO2:  100%  Weight: 49 kg 49.7 kg  Height: 5' 1 (1.549 m) 5' 1 (1.549 m)    Results for orders placed or performed during the hospital encounter of 10/07/23 (from the past 72 hours)  ABO/Rh     Status: None (Preliminary result)   Collection Time: 10/07/23  6:26 AM  Result Value Ref Range   ABO/RH(D) PENDING     Rosaline DELENA Luna

## 2023-10-07 NOTE — Interval H&P Note (Signed)
 History and Physical Interval Note:  10/07/2023 7:07 AM  Anna Mclean  has presented today for surgery, with the diagnosis of uterine leiomyoma.  The various methods of treatment have been discussed with the patient and family. After consideration of risks, benefits and other options for treatment, the patient has consented to  Procedure(s): HYSTERECTOMY, TOTAL, ROBOT-ASSISTED, LAPAROSCOPIC, WITH BILATERAL SALPINGO-OOPHORECTOMY (N/A) CYSTOSCOPY (N/A) as a surgical intervention.  The patient's history has been reviewed, patient examined, no change in status, stable for surgery.  I have reviewed the patient's chart and labs.  Questions were answered to the patient's satisfaction.     Anna Mclean

## 2023-10-08 ENCOUNTER — Encounter (HOSPITAL_COMMUNITY): Payer: Self-pay | Admitting: Obstetrics and Gynecology

## 2023-10-08 ENCOUNTER — Other Ambulatory Visit (HOSPITAL_COMMUNITY): Payer: Self-pay

## 2023-10-08 LAB — CBC
HCT: 31.4 % — ABNORMAL LOW (ref 36.0–46.0)
Hemoglobin: 10.8 g/dL — ABNORMAL LOW (ref 12.0–15.0)
MCH: 31.6 pg (ref 26.0–34.0)
MCHC: 34.4 g/dL (ref 30.0–36.0)
MCV: 91.8 fL (ref 80.0–100.0)
Platelets: 199 K/uL (ref 150–400)
RBC: 3.42 MIL/uL — ABNORMAL LOW (ref 3.87–5.11)
RDW: 12.1 % (ref 11.5–15.5)
WBC: 17.1 K/uL — ABNORMAL HIGH (ref 4.0–10.5)
nRBC: 0 % (ref 0.0–0.2)

## 2023-10-08 MED ORDER — NITROFURANTOIN MONOHYD MACRO 100 MG PO CAPS
100.0000 mg | ORAL_CAPSULE | Freq: Two times a day (BID) | ORAL | 0 refills | Status: AC
  Filled 2023-10-08: qty 14, 7d supply, fill #0

## 2023-10-08 MED ORDER — IBUPROFEN 800 MG PO TABS
800.0000 mg | ORAL_TABLET | Freq: Three times a day (TID) | ORAL | 0 refills | Status: AC
  Filled 2023-10-08: qty 30, 10d supply, fill #0

## 2023-10-08 MED ORDER — TOLTERODINE TARTRATE ER 2 MG PO CP24
2.0000 mg | ORAL_CAPSULE | Freq: Every day | ORAL | 1 refills | Status: AC
  Filled 2023-10-08: qty 30, 30d supply, fill #0

## 2023-10-08 MED ORDER — OXYCODONE-ACETAMINOPHEN 5-325 MG PO TABS
1.0000 | ORAL_TABLET | ORAL | 0 refills | Status: AC | PRN
  Filled 2023-10-08: qty 30, 5d supply, fill #0

## 2023-10-08 NOTE — Plan of Care (Signed)
  Problem: Education: Goal: Knowledge of General Education information will improve Description: Including pain rating scale, medication(s)/side effects and non-pharmacologic comfort measures Outcome: Progressing   Problem: Clinical Measurements: Goal: Ability to maintain clinical measurements within normal limits will improve Outcome: Progressing   Problem: Activity: Goal: Risk for activity intolerance will decrease Outcome: Progressing   Problem: Nutrition: Goal: Adequate nutrition will be maintained Outcome: Progressing   Problem: Coping: Goal: Level of anxiety will decrease Outcome: Progressing   Problem: Pain Managment: Goal: General experience of comfort will improve and/or be controlled Outcome: Progressing

## 2023-10-08 NOTE — Progress Notes (Signed)
 Patient is eating, ambulating, and voiding through cath.  Pain control is good.  Vitals:   10/07/23 2117 10/08/23 0035 10/08/23 0440 10/08/23 0500  BP: (!) 154/68 123/65 136/70 116/63  Pulse:  76 68 72  Resp:  17 17 16   Temp:  98.6 F (37 C) 99.1 F (37.3 C) 98.2 F (36.8 C)  TempSrc:  Oral Oral Oral  SpO2:  100% 100% 100%  Weight:      Height:        lungs:   clear to auscultation cor:    RRR Abdomen:  soft, appropriate tenderness, incisions intact and without erythema or exudate. ex:    no cords   Lab Results  Component Value Date   WBC 17.1 (H) 10/08/2023   HGB 10.8 (L) 10/08/2023   HCT 31.4 (L) 10/08/2023   MCV 91.8 10/08/2023   PLT 199 10/08/2023    A/P  Routine care.  Expect d/c per plan.  Cath in for 7-10 days and cystogram at office with Dr. Renda- appreciate his help greatly!  Will d/c with detrol  for control of spasms and antibiotics (almost certainly will have UTi at some point.)

## 2023-10-08 NOTE — Discharge Summary (Signed)
 Physician Discharge Summary  Patient ID: Anna Mclean MRN: [id number] DOB/AGE: 1960/02/21 63 y.o.  Admit date: 10/07/2023 Discharge date: 10/08/2023  Admission Diagnoses:symptomatic fibroid uterus  Discharge Diagnoses:  Principal Problem:   Postoperative state   Discharged Condition: good  Hospital Course: Robotic TLH, salpingectomies,cysto complicated by cystotomy, repaired   Consults: urology  Significant Diagnostic Studies: labs:  Results for orders placed or performed during the hospital encounter of 10/07/23 (from the past 24 hours)  CBC     Status: Abnormal   Collection Time: 10/08/23  3:47 AM  Result Value Ref Range   WBC 17.1 (H) 4.0 - 10.5 K/uL   RBC 3.42 (L) 3.87 - 5.11 MIL/uL   Hemoglobin 10.8 (L) 12.0 - 15.0 g/dL   HCT 68.5 (L) 63.9 - 53.9 %   MCV 91.8 80.0 - 100.0 fL   MCH 31.6 26.0 - 34.0 pg   MCHC 34.4 30.0 - 36.0 g/dL   RDW 87.8 88.4 - 84.4 %   Platelets 199 150 - 400 K/uL   nRBC 0.0 0.0 - 0.2 %     Treatments: surgery: Robotic TLH, salpingectomies,cysto; ureteral stents (temp) and repair of cystotomy  Discharge Exam: Blood pressure 116/63, pulse 72, temperature 98.2 F (36.8 C), temperature source Oral, resp. rate 16, height 5' 1 (1.549 m), weight 49.7 kg, last menstrual period 07/30/2015, SpO2 100%.   Disposition: Discharge disposition: 01-Home or Self Care       Discharge Instructions     Call MD for:  temperature >100.4   Complete by: As directed    Diet - low sodium heart healthy   Complete by: As directed    Discharge instructions   Complete by: As directed    No driving on narcotics, no sexual activity for 2 weeks.   Increase activity slowly   Complete by: As directed    May shower / Bathe   Complete by: As directed    Shower, no bath for 2 weeks.   Remove dressing in 24 hours   Complete by: As directed    Sexual Activity Restrictions   Complete by: As directed    No sexual activity for 2 weeks.      Allergies as of  10/08/2023       Reactions   Shellfish Allergy Hives   Strawberry (diagnostic) Rash        Medication List     STOP taking these medications    aspirin EC 325 MG tablet   Fyavolv 1-5 MG-MCG Tabs tablet Generic drug: norethindrone-ethinyl estradiol       TAKE these medications    benzoyl peroxide 5 % cream Apply 1 Application topically daily as needed (acne).   ferrous sulfate 325 (65 FE) MG tablet Take 325 mg by mouth daily.   ibuprofen  800 MG tablet Commonly known as: ADVIL  Take 1 tablet (800 mg total) by mouth 3 (three) times daily.   nitrofurantoin  (macrocrystal-monohydrate) 100 MG capsule Commonly known as: Macrobid  Take 1 capsule (100 mg total) by mouth 2 (two) times daily for 7 days.   oxyCODONE -acetaminophen  5-325 MG tablet Commonly known as: PERCOCET/ROXICET Take 1 tablet by mouth every 4 (four) hours as needed for severe pain (pain score 7-10).   REFRESH OP Place 1 drop into both eyes daily as needed (dry eyes).   tolterodine  2 MG 24 hr capsule Commonly known as: Detrol  LA Take 1 capsule (2 mg total) by mouth daily.   VITAMIN E PO Take 1 capsule by mouth daily.  Follow-up Information     Sarrah Browning, MD Follow up in 2 week(s).   Specialty: Obstetrics and Gynecology Contact information: [address] [place] [place] [address] [phone]         Renda Glance, MD Follow up in 10 day(s).   Specialty: Urology Contact information: [address] [place] [address] [phone]                 Signed: Browning DELENA Sarrah 10/08/2023, 6:51 AM

## 2023-10-08 NOTE — TOC Initial Note (Addendum)
 Transition of Care (TOC) - Initial/Assessment Note   Patient from home alone. Planning on staying with her sister at discharge. Sister will provide transportation home at discharge.   Patient has PCP at [facility] , however unsure of providers name.   Patient does not have insurance, referral sent to financial counselor .    Prescriptions sent to [facility] . Total cost $248.00. Patient cannot afford. Will fill through [facility] program. Patient only eligible for MATCH once a year  Patient Details  Name: Anna Mclean MRN: [id number] Date of Birth: [birth year]  Transition of Care [facility]) CM/SW Contact:    Stephane Powell Jansky, RN Phone Number: 10/08/2023, 8:27 AM  Clinical Narrative:                   Expected Discharge Plan: Home/Self Care Barriers to Discharge: No Barriers Identified   Patient Goals and CMS Choice Patient states their goals for this hospitalization and ongoing recovery are:: to return to home   Choice offered to / list presented to : NA      Expected Discharge Plan and Services In-house Referral: Financial Counselor Discharge Planning Services: CM Consult Post Acute Care Choice: NA   Expected Discharge Date: 10/08/23                 DME Agency: NA       HH Arranged: NA HH Agency: NA        Prior Living Arrangements/Services   Lives with:: Self Patient language and need for interpreter reviewed:: Yes Do you feel safe going back to the place where you live?: Yes      Need for Family Participation in Patient Care: Yes (Comment) Care giver support system in place?: Yes (comment)   Criminal Activity/Legal Involvement Pertinent to Current Situation/Hospitalization: No - Comment as needed  Activities of Daily Living   ADL Screening (condition at time of admission) Independently performs ADLs?: Yes (appropriate for developmental age) Is the patient deaf or have difficulty hearing?: No Does the patient have difficulty seeing, even when  wearing glasses/contacts?: No Does the patient have difficulty concentrating, remembering, or making decisions?: No  Permission Sought/Granted   Permission granted to share information with : No              Emotional Assessment Appearance:: Appears stated age Attitude/Demeanor/Rapport: Engaged Affect (typically observed): Appropriate Orientation: : Oriented to Self, Oriented to Place, Oriented to  Time, Oriented to Situation Alcohol / Substance Use: Not Applicable Psych Involvement: No (comment)  Admission diagnosis:  Uterine leiomyoma, unspecified location [D25.9] Postoperative state [Z98.890] Patient Active Problem List   Diagnosis Date Noted   Postoperative state 10/07/2023   Borderline hypertension 12/22/2012   Borderline hyperlipidemia    Iron deficiency anemia    Eczema of hand    PCP:  System, Provider Not In Pharmacy:   [id number] - [place], [place] - [address] [facility] [address] [place] [address] Phone: [phone] Fax: [phone]  [facility] [address] [place] [address] Phone: [phone] Fax: [phone]     Social Drivers of Health (SDOH) Social History: SDOH Screenings   Food Insecurity: No Food Insecurity (10/07/2023)  Housing: Low Risk  (10/07/2023)  Transportation Needs: No Transportation Needs (10/07/2023)  Utilities: Not At Risk (10/07/2023)  Tobacco Use: Low Risk  (10/07/2023)   SDOH Interventions:     Readmission Risk Interventions     No data  to display

## 2023-10-08 NOTE — Anesthesia Postprocedure Evaluation (Signed)
 Anesthesia Post Note  Patient: Anna Mclean  Procedure(s) Performed: HYSTERECTOMY, TOTAL, ROBOT-ASSISTED, LAPAROSCOPIC, WITH BILATERAL SALPINGECTOMY (Pelvis) CYSTOSCOPY (Bladder) CYSTOSCOPY (Bladder) REPAIR, BLADDER/ BILATERAL URETERAL CATHETERIZATION (Bladder)     Patient location during evaluation: PACU Anesthesia Type: General Level of consciousness: awake and alert Pain management: pain level controlled Vital Signs Assessment: post-procedure vital signs reviewed and stable Respiratory status: spontaneous breathing, nonlabored ventilation, respiratory function stable and patient connected to nasal cannula oxygen Cardiovascular status: blood pressure returned to baseline and stable Postop Assessment: no apparent nausea or vomiting Anesthetic complications: no   No notable events documented.  Last Vitals:  Vitals:   10/08/23 0500 10/08/23 0803  BP: 116/63 119/65  Pulse: 72 74  Resp: 16 17  Temp: 36.8 C 36.9 C  SpO2: 100% 100%    Last Pain:  Vitals:   10/08/23 0803  TempSrc: Oral  PainSc:                  Thom [name]

## 2023-10-09 LAB — SURGICAL PATHOLOGY

## 2023-10-12 ENCOUNTER — Other Ambulatory Visit (HOSPITAL_COMMUNITY): Payer: Self-pay | Admitting: Urology

## 2023-10-12 DIAGNOSIS — S3729XS Other injury of bladder, sequela: Secondary | ICD-10-CM

## 2023-10-21 ENCOUNTER — Ambulatory Visit (HOSPITAL_COMMUNITY)
Admission: RE | Admit: 2023-10-21 | Discharge: 2023-10-21 | Disposition: A | Discharge status: Home / Self Care | Payer: Self-pay | Source: Ambulatory Visit | Attending: Urology | Admitting: Urology

## 2023-10-21 DIAGNOSIS — S3729XS Other injury of bladder, sequela: Secondary | ICD-10-CM

## 2023-10-21 DIAGNOSIS — S3729XA Other injury of bladder, initial encounter: Secondary | ICD-10-CM | POA: Insufficient documentation

## 2023-10-21 DIAGNOSIS — X58XXXA Exposure to other specified factors, initial encounter: Secondary | ICD-10-CM | POA: Insufficient documentation

## 2023-10-21 MED ORDER — IOTHALAMATE MEGLUMINE 17.2 % UR SOLN
URETHRAL | Status: AC
  Filled 2023-10-21: qty 250

## 2023-10-21 MED ORDER — IOTHALAMATE MEGLUMINE 17.2 % UR SOLN
250.0000 mL | Freq: Once | URETHRAL | Status: AC | PRN
  Administered 2023-10-21: 150 mL via INTRAVESICAL
